# Patient Record
Sex: Female | Born: 1985 | Race: Black or African American | Hispanic: No | Marital: Single | Smoking: Current every day smoker
Health system: Southern US, Community
[De-identification: ages and names within clinical notes are randomized; demographics above are authoritative.]

---

## 2015-02-14 ENCOUNTER — Encounter (HOSPITAL_BASED_OUTPATIENT_CLINIC_OR_DEPARTMENT_OTHER): Payer: Self-pay | Admitting: Emergency Medicine

## 2015-02-14 ENCOUNTER — Emergency Department (HOSPITAL_BASED_OUTPATIENT_CLINIC_OR_DEPARTMENT_OTHER): Payer: Self-pay

## 2015-02-14 ENCOUNTER — Inpatient Hospital Stay (HOSPITAL_BASED_OUTPATIENT_CLINIC_OR_DEPARTMENT_OTHER)
Admission: EM | Admit: 2015-02-14 | Discharge: 2015-02-17 | DRG: 854 | Disposition: A | Discharge status: Home / Self Care | Payer: Self-pay | Attending: Internal Medicine | Admitting: Internal Medicine

## 2015-02-14 DIAGNOSIS — N1330 Unspecified hydronephrosis: Secondary | ICD-10-CM | POA: Diagnosis present

## 2015-02-14 DIAGNOSIS — A419 Sepsis, unspecified organism: Principal | ICD-10-CM | POA: Diagnosis present

## 2015-02-14 DIAGNOSIS — N12 Tubulo-interstitial nephritis, not specified as acute or chronic: Secondary | ICD-10-CM | POA: Diagnosis present

## 2015-02-14 DIAGNOSIS — F17200 Nicotine dependence, unspecified, uncomplicated: Secondary | ICD-10-CM | POA: Diagnosis present

## 2015-02-14 DIAGNOSIS — N200 Calculus of kidney: Secondary | ICD-10-CM

## 2015-02-14 DIAGNOSIS — Z8249 Family history of ischemic heart disease and other diseases of the circulatory system: Secondary | ICD-10-CM

## 2015-02-14 DIAGNOSIS — B9620 Unspecified Escherichia coli [E. coli] as the cause of diseases classified elsewhere: Secondary | ICD-10-CM | POA: Diagnosis present

## 2015-02-14 DIAGNOSIS — E876 Hypokalemia: Secondary | ICD-10-CM | POA: Diagnosis present

## 2015-02-14 DIAGNOSIS — N132 Hydronephrosis with renal and ureteral calculous obstruction: Secondary | ICD-10-CM | POA: Diagnosis present

## 2015-02-14 DIAGNOSIS — Z808 Family history of malignant neoplasm of other organs or systems: Secondary | ICD-10-CM

## 2015-02-14 DIAGNOSIS — R109 Unspecified abdominal pain: Secondary | ICD-10-CM | POA: Diagnosis present

## 2015-02-14 LAB — COMPREHENSIVE METABOLIC PANEL
ALBUMIN: 3.8 g/dL (ref 3.5–5.0)
ALT: 35 U/L (ref 14–54)
ANION GAP: 12 (ref 5–15)
AST: 31 U/L (ref 15–41)
Alkaline Phosphatase: 56 U/L (ref 38–126)
BUN: 7 mg/dL (ref 6–20)
CHLORIDE: 102 mmol/L (ref 101–111)
CO2: 21 mmol/L — AB (ref 22–32)
Calcium: 9.2 mg/dL (ref 8.9–10.3)
Creatinine, Ser: 0.84 mg/dL (ref 0.44–1.00)
GFR calc Af Amer: >60 mL/min (ref >60)
GFR calc non Af Amer: >60 mL/min (ref >60)
GLUCOSE: 159 mg/dL — AB (ref 65–99)
POTASSIUM: 3.1 mmol/L — AB (ref 3.5–5.1)
SODIUM: 135 mmol/L (ref 135–145)
Total Bilirubin: 1.3 mg/dL — ABNORMAL HIGH (ref 0.3–1.2)
Total Protein: 8.1 g/dL (ref 6.5–8.1)

## 2015-02-14 LAB — CBC WITH DIFFERENTIAL/PLATELET
Basophils Absolute: 0 10*3/uL (ref 0.0–0.1)
Basophils Relative: 0 %
Eosinophils Absolute: 0 10*3/uL (ref 0.0–0.7)
Eosinophils Relative: 0 %
HEMATOCRIT: 38.3 % (ref 36.0–46.0)
Hemoglobin: 13.5 g/dL (ref 12.0–15.0)
LYMPHS ABS: 1.1 10*3/uL (ref 0.7–4.0)
LYMPHS PCT: 9 %
MCH: 34.4 pg — ABNORMAL HIGH (ref 26.0–34.0)
MCHC: 35.2 g/dL (ref 30.0–36.0)
MCV: 97.5 fL (ref 78.0–100.0)
MONO ABS: 1.5 10*3/uL — AB (ref 0.1–1.0)
MONOS PCT: 11 %
NEUTROS ABS: 10.7 10*3/uL — AB (ref 1.7–7.7)
Neutrophils Relative %: 80 %
Platelets: 261 10*3/uL (ref 150–400)
RBC: 3.93 MIL/uL (ref 3.87–5.11)
RDW: 12.7 % (ref 11.5–15.5)
WBC: 13.3 10*3/uL — ABNORMAL HIGH (ref 4.0–10.5)

## 2015-02-14 LAB — URINALYSIS, ROUTINE W REFLEX MICROSCOPIC
Bilirubin Urine: NEGATIVE
GLUCOSE, UA: NEGATIVE mg/dL
Ketones, ur: >80 mg/dL — AB
Nitrite: POSITIVE — AB
PROTEIN: 30 mg/dL — AB
Specific Gravity, Urine: 1.015 (ref 1.005–1.030)
Urobilinogen, UA: 1 mg/dL (ref 0.0–1.0)
pH: 6 (ref 5.0–8.0)

## 2015-02-14 LAB — URINE MICROSCOPIC-ADD ON

## 2015-02-14 LAB — I-STAT CG4 LACTIC ACID, ED: LACTIC ACID, VENOUS: 1.76 mmol/L (ref 0.5–2.0)

## 2015-02-14 LAB — PREGNANCY, URINE: PREG TEST UR: NEGATIVE

## 2015-02-14 MED ORDER — CEFTRIAXONE SODIUM 2 G IJ SOLR
INTRAMUSCULAR | Status: AC
  Filled 2015-02-14: qty 2

## 2015-02-14 MED ORDER — SODIUM CHLORIDE 0.9 % IV SOLN
INTRAVENOUS | Status: AC
  Administered 2015-02-14: 150 mL/h via INTRAVENOUS
  Administered 2015-02-15: 06:00:00 via INTRAVENOUS

## 2015-02-14 MED ORDER — ONDANSETRON HCL 4 MG/2ML IJ SOLN
4.0000 mg | Freq: Four times a day (QID) | INTRAMUSCULAR | Status: DC | PRN
  Administered 2015-02-15: 4 mg via INTRAVENOUS

## 2015-02-14 MED ORDER — ACETAMINOPHEN 650 MG RE SUPP: 650.0000 mg | Freq: Four times a day (QID) | RECTAL | Status: DC | PRN

## 2015-02-14 MED ORDER — ONDANSETRON HCL 4 MG PO TABS: 4.0000 mg | ORAL_TABLET | Freq: Four times a day (QID) | ORAL | Status: DC | PRN

## 2015-02-14 MED ORDER — HYDROMORPHONE HCL 1 MG/ML IJ SOLN
0.5000 mg | INTRAMUSCULAR | Status: DC | PRN
  Administered 2015-02-14 – 2015-02-15 (×2): 0.5 mg via INTRAVENOUS
  Filled 2015-02-14 (×2): qty 1

## 2015-02-14 MED ORDER — HYDROCODONE-ACETAMINOPHEN 5-325 MG PO TABS
1.0000 | ORAL_TABLET | ORAL | Status: DC | PRN
  Administered 2015-02-15 – 2015-02-17 (×8): 2 via ORAL
  Filled 2015-02-14 (×9): qty 2

## 2015-02-14 MED ORDER — SODIUM CHLORIDE 0.9 % IV BOLUS (SEPSIS)
1000.0000 mL | INTRAVENOUS | Status: AC
  Administered 2015-02-14 (×2): 1000 mL via INTRAVENOUS

## 2015-02-14 MED ORDER — POTASSIUM CHLORIDE 10 MEQ/100ML IV SOLN
10.0000 meq | INTRAVENOUS | Status: AC
  Administered 2015-02-14 – 2015-02-15 (×4): 10 meq via INTRAVENOUS
  Filled 2015-02-14 (×4): qty 100

## 2015-02-14 MED ORDER — ACETAMINOPHEN 325 MG PO TABS: 650.0000 mg | ORAL_TABLET | Freq: Four times a day (QID) | ORAL | Status: DC | PRN

## 2015-02-14 MED ORDER — ACETAMINOPHEN 325 MG PO TABS
650.0000 mg | ORAL_TABLET | Freq: Once | ORAL | Status: AC
  Administered 2015-02-14: 650 mg via ORAL
  Filled 2015-02-14: qty 2

## 2015-02-14 MED ORDER — DEXTROSE 5 % IV SOLN
2.0000 g | Freq: Once | INTRAVENOUS | Status: AC
  Administered 2015-02-14: 2 g via INTRAVENOUS

## 2015-02-14 MED ORDER — DEXTROSE 5 % IV SOLN
1.0000 g | INTRAVENOUS | Status: DC
  Administered 2015-02-15 – 2015-02-16 (×2): 1 g via INTRAVENOUS
  Filled 2015-02-14 (×2): qty 10

## 2015-02-14 MED ORDER — SODIUM CHLORIDE 0.9 % IJ SOLN
3.0000 mL | Freq: Two times a day (BID) | INTRAMUSCULAR | Status: DC
  Administered 2015-02-14 – 2015-02-17 (×3): 3 mL via INTRAVENOUS

## 2015-02-14 MED ORDER — SODIUM CHLORIDE 0.9 % IV BOLUS (SEPSIS)
1000.0000 mL | Freq: Once | INTRAVENOUS | Status: AC
  Administered 2015-02-14: 1000 mL via INTRAVENOUS

## 2015-02-14 MED ORDER — SODIUM CHLORIDE 0.9 % IV BOLUS (SEPSIS): 500.0000 mL | INTRAVENOUS | Status: AC

## 2015-02-14 NOTE — ED Provider Notes (Signed)
CSN: [id number]     Arrival date & time 02/14/15  1725 History   First MD Initiated Contact with Patient 02/14/15 1747     Chief Complaint  Patient presents with  . Flank Pain     (Consider location/radiation/quality/duration/timing/severity/associated sxs/prior Treatment) HPI Comments: Patient presents with right flank pain. She states over last 3 days she's had some urinary urgency and frequency. She started having some pain in her right lower back which is worsened and radiated to her right side. She denies any hematuria. She does have a history of UTIs in the past but no history of kidney stones. Today she started feeling worse. She's been febrile. She has myalgias. She's had objective fevers and chills since yesterday. She denies any nausea or vomiting. There is no diarrhea. No cough or chest congestion. No wounds or rashes. She's taken Tylenol with some relief in symptoms.  Patient is a 29 y.o. female presenting with flank pain.  Flank Pain Associated symptoms include abdominal pain. Pertinent negatives include no chest pain, no headaches and no shortness of breath.    History reviewed. No pertinent past medical history. History reviewed. No pertinent past surgical history. No family history on file. Social History  Substance Use Topics  . Smoking status: Current Every Day Smoker -- 0.50 packs/day  . Smokeless tobacco: None  . Alcohol Use: Yes     Comment: rarely   OB History    No data available     Review of Systems  Constitutional: Positive for fever, chills, appetite change and fatigue. Negative for diaphoresis.  HENT: Negative for congestion, rhinorrhea and sneezing.   Eyes: Negative.   Respiratory: Negative for cough, chest tightness and shortness of breath.   Cardiovascular: Negative for chest pain and leg swelling.  Gastrointestinal: Positive for abdominal pain. Negative for nausea, vomiting, diarrhea and blood in stool.  Genitourinary: Positive for flank pain.  Negative for frequency, hematuria and difficulty urinating.  Musculoskeletal: Negative for back pain and arthralgias.  Skin: Negative for rash.  Neurological: Negative for dizziness, speech difficulty, weakness, numbness and headaches.      Allergies  Review of patient's allergies indicates no known allergies.  Home Medications   Prior to Admission medications   Not on File   BP 137/72 mmHg  Pulse 87  Temp(Src) 101.2 F (38.4 C) (Oral)  Resp 16  Ht _0  (1.702 m)  Wt 170 lb (77.111 kg)  BMI 26.62 kg/m2  SpO2 100%  LMP 12/17/2014 (Exact Date) Physical Exam  Constitutional: She is oriented to person, place, and time. She appears well-developed and well-nourished.  HENT:  Head: Normocephalic and atraumatic.  Eyes: Pupils are equal, round, and reactive to light.  Neck: Normal range of motion. Neck supple.  Cardiovascular: Regular rhythm and normal heart sounds.  Tachycardia present.   Pulmonary/Chest: Effort normal and breath sounds normal. Tachypnea noted. No respiratory distress. She has no wheezes. She has no rales. She exhibits no tenderness.  Abdominal: Soft. Bowel sounds are normal. There is tenderness (moderate tenderness to right flank and right midabdomen. She has positive right CVA tenderness). There is no rebound and no guarding.  Musculoskeletal: Normal range of motion. She exhibits no edema.  Lymphadenopathy:    She has no cervical adenopathy.  Neurological: She is alert and oriented to person, place, and time.  Skin: Skin is warm and dry. No rash noted.  Psychiatric: She has a normal mood and affect.    ED Course  Procedures (including critical care time) Labs  Review Labs Reviewed  URINALYSIS, ROUTINE W REFLEX MICROSCOPIC (NOT AT [facility]) - Abnormal; Notable for the following:    Color, Urine AMBER (*)    APPearance CLOUDY (*)    Hgb urine dipstick SMALL (*)    Ketones, ur >80 (*)    Protein, ur 30 (*)    Nitrite POSITIVE (*)    Leukocytes, UA MODERATE  (*)    All other components within normal limits  COMPREHENSIVE METABOLIC PANEL - Abnormal; Notable for the following:    Potassium 3.1 (*)    CO2 21 (*)    Glucose, Bld 159 (*)    Total Bilirubin 1.3 (*)    All other components within normal limits  CBC WITH DIFFERENTIAL/PLATELET - Abnormal; Notable for the following:    WBC 13.3 (*)    MCH 34.4 (*)    Neutro Abs 10.7 (*)    Monocytes Absolute 1.5 (*)    All other components within normal limits  URINE MICROSCOPIC-ADD ON - Abnormal; Notable for the following:    Squamous Epithelial / LPF FEW (*)    Bacteria, UA MANY (*)    All other components within normal limits  CULTURE, BLOOD (ROUTINE X 2)  CULTURE, BLOOD (ROUTINE X 2)  URINE CULTURE  PREGNANCY, URINE  I-STAT CG4 LACTIC ACID, ED    Imaging Review Dg Chest 2 View  02/14/2015   CLINICAL DATA:  Right-sided chest pain for 3 days.  EXAM: CHEST  2 VIEW  COMPARISON:  None.  FINDINGS: Bilateral mild interstitial thickening which may reflect an infectious or inflammatory etiology. There is no focal parenchymal opacity. There is no pleural effusion or pneumothorax. The heart and mediastinal contours are unremarkable.  The osseous structures are unremarkable.  IMPRESSION: Bilateral mild interstitial thickening which may reflect an infectious or inflammatory etiology.   Electronically Signed   By: Kathreen Devoid   On: 02/14/2015 18:50   Ct Renal Stone Study  02/14/2015   CLINICAL DATA:  Right flank pain and right lower quadrant pain for 2 days.  EXAM: CT ABDOMEN AND PELVIS WITHOUT CONTRAST  TECHNIQUE: Multidetector CT imaging of the abdomen and pelvis was performed following the standard protocol without IV contrast.  COMPARISON:  None.  FINDINGS: The liver, spleen, pancreas, gallbladder, adrenal glands and left kidney are normal. The aorta is normal. There is no abdominal lymphadenopathy. There is no small bowel obstruction or diverticulitis. The appendix is normal.  There is right perinephric  stranding. There is no nephrolithiasis bilaterally. There is mild right hydronephrosis with probable 1 mm stone in the proximal right ureter on image for 43.  The bladder is normal. The uterus is normal. There is a 4 x 5 cm cyst in the right ovary likely follicular cysts. Pelvic phleboliths are identified. The lung bases are clear. No acute abnormalities identified in the visualized bones.  IMPRESSION: Stranding and inflammation surrounding the right kidney. There is mild right hydronephrosis probably due to a stones 1 mm in the proximal right ureter.  The appendix is normal.   Electronically Signed   By: Abelardo Diesel M.D.   On: 02/14/2015 18:54   I have personally reviewed and evaluated these images and lab results as part of my medical decision-making.   EKG Interpretation   Date/Time:  Friday February 14 2015 18:08:04 EDT Ventricular Rate:  82 PR Interval:  144 QRS Duration: 88 QT Interval:  352 QTC Calculation: 411 R Axis:   12 Text Interpretation:  Normal sinus rhythm Cannot rule out  Anterior infarct  , age undetermined Abnormal ECG No old tracing to compare Confirmed by  [place]  MD, [place] [phone]) on 02/14/2015 6:49:07 PM      MDM   Final diagnoses:  Pyelonephritis    Patient presents with right flank pain and associated urinary symptoms. She met criteria for sepsis on arrival and sepsis orders were initiated. She was covered with Rocephin for likely pyelonephritis. She did have some interstitial changes on chest x-ray but she doesn't have any symptoms that would be more consistent with pneumonia. I did obtain a CT scan which showed a possible proximal 1 mm ureteral stone. I consulted with Dr.) with urology who recommends the patient be admitted to the hospital service and he will consult on the patient. I spoke with Dr. Dia Crawford who is accepted patient for admission to [facility]. Per Dr. Ralene Muskrat request, patient is to be nothing by mouth until urology consult on the  patient.  CRITICAL CARE Performed by: BELFI, MELANIE Total critical care time: 45 Critical care time was exclusive of separately billable procedures and treating other patients. Critical care was necessary to treat or prevent imminent or life-threatening deterioration. Critical care was time spent personally by me on the following activities: development of treatment plan with patient and/or surrogate as well as nursing, discussions with consultants, evaluation of patient's response to treatment, examination of patient, obtaining history from patient or surrogate, ordering and performing treatments and interventions, ordering and review of laboratory studies, ordering and review of radiographic studies, pulse oximetry and re-evaluation of patient's condition.     Malvin Johns, MD 02/14/15 2006

## 2015-02-14 NOTE — H&P (Signed)
PCP: none recently moved   Referring provider Transferred from [facility]   Chief Complaint:  Right flank pain  HPI: Tricia Kelly is a 29 y.o. female   has no past medical history on file.   Presented with  3 day hx of back pain now moving to the flank. She have had a fever for the past 2-3 days she though it was a virus denies any pain with urination. She have had some headaches and feeling weak. Denies any nausea vomiting or diarrhea.   Hospitalist was called for admission for Pyelonephritis and hydronephrosis  Review of Systems:    Pertinent positives include: Fevers, chills, back pain  Constitutional:  No weight loss, night sweats, fatigue, weight loss  HEENT:  No headaches, Difficulty swallowing,Tooth/dental problems,Sore throat,  No sneezing, itching, ear ache, nasal congestion, post nasal drip,  Cardio-vascular:  No chest pain, Orthopnea, PND, anasarca, dizziness, palpitations.no Bilateral lower extremity swelling  GI:  No heartburn, indigestion, abdominal pain, nausea, vomiting, diarrhea, change in bowel habits, loss of appetite, melena, blood in stool, hematemesis Resp:  no shortness of breath at rest. No dyspnea on exertion, No excess mucus, no productive cough, No non-productive cough, No coughing up of blood.No change in color of mucus.No wheezing. Skin:  no rash or lesions. No jaundice GU:  no dysuria, change in color of urine, no urgency or frequency. No straining to urinate.  No flank pain.  Musculoskeletal:  No joint pain or no joint swelling. No decreased range of motion. No back pain.  Psych:  No change in mood or affect. No depression or anxiety. No memory loss.  Neuro: no localizing neurological complaints, no tingling, no weakness, no double vision, no gait abnormality, no slurred speech, no confusion  Otherwise ROS are negative except for above, 10 systems were reviewed  Past Medical History: History reviewed. No pertinent past medical  history. History reviewed. No pertinent past surgical history.   Medications: Prior to Admission medications   Medication Sig Start Date End Date Taking? Authorizing Provider  acetaminophen (TYLENOL) 500 MG tablet Take 1,000 mg by mouth every 6 (six) hours as needed for moderate pain.   Yes Historical Provider, MD    Allergies:  No Known Allergies  Social History:  Ambulatory  Independently  Lives at home alone,       reports that she has been smoking.  She does not have any smokeless tobacco history on file. She reports that she drinks alcohol.    Family History: family history includes Hypertension in her mother and other; Thyroid cancer in her mother.    Physical Exam: Patient Vitals for the past 24 hrs:  BP Temp Temp src Pulse Resp SpO2 Height Weight  02/14/15 2151 123/77 mmHg 98.2 F (36.8 C) Oral 76 18 100 % - -  02/14/15 2030 130/82 mmHg - - 83 22 100 % - -  02/14/15 2015 129/70 mmHg - - 78 16 100 % - -  02/14/15 2008 - 98.9 F (37.2 C) - - - - - -  02/14/15 2000 134/77 mmHg - - 85 17 100 % - -  02/14/15 1945 144/80 mmHg - - 84 22 100 % - -  02/14/15 1930 137/72 mmHg - - 87 16 100 % - -  02/14/15 1915 135/71 mmHg - - 90 25 100 % - -  02/14/15 1845 132/63 mmHg - - 94 17 100 % - -  02/14/15 1830 149/85 mmHg - - 92 17 100 % - -  02/14/15  1801 138/81 mmHg - - 91 (!) 27 100 % - -  02/14/15 1759 - - - 91 16 100 % - -  02/14/15 1746 - - - 105 (!) 30 100 % - -  02/14/15 1730 141/88 mmHg 101.2 F (38.4 C) Oral (!) 130 (!) 44 100 %  (1.702 m) 77.111 kg (170 lb)    1. General:  in No Acute distress 2. Psychological: Alert and   Oriented 3. Head/ENT:    Dry Mucous Membranes                          Head Non traumatic, neck supple                          Normal   Dentition 4. SKIN:  decreased Skin turgor,  Skin clean Dry and intact no rash 5. Heart: Regular rate and rhythm no Murmur, Rub or gallop 6. Lungs: Clear to auscultation bilaterally, no wheezes or  crackles   7. Abdomen: Soft, right lower tenderness, Non distended 8. Lower extremities: no clubbing, cyanosis, or edema 9. Neurologically Grossly intact, moving all 4 extremities equally 10. MSK: Normal range of motion  body mass index is 26.62 kg/(m^2).   Labs on Admission:   Results for orders placed or performed during the hospital encounter of 02/14/15 (from the past 24 hour(s))  Urinalysis, Routine w reflex microscopic (not at [facility])     Status: Abnormal   Collection Time: 02/14/15  5:35 PM  Result Value Ref Range   Color, Urine AMBER (A) YELLOW   APPearance CLOUDY (A) CLEAR   Specific Gravity, Urine 1.015 1.005 - 1.030   pH 6.0 5.0 - 8.0   Glucose, UA NEGATIVE NEGATIVE mg/dL   Hgb urine dipstick SMALL (A) NEGATIVE   Bilirubin Urine NEGATIVE NEGATIVE   Ketones, ur >80 (A) NEGATIVE mg/dL   Protein, ur 30 (A) NEGATIVE mg/dL   Urobilinogen, UA 1.0 0.0 - 1.0 mg/dL   Nitrite POSITIVE (A) NEGATIVE   Leukocytes, UA MODERATE (A) NEGATIVE  Pregnancy, urine     Status: None   Collection Time: 02/14/15  5:35 PM  Result Value Ref Range   Preg Test, Ur NEGATIVE NEGATIVE  Urine microscopic-add on     Status: Abnormal   Collection Time: 02/14/15  5:35 PM  Result Value Ref Range   Squamous Epithelial / LPF FEW (A) RARE   WBC, UA 21-50 <3 WBC/hpf   RBC / HPF 3-6 <3 RBC/hpf   Bacteria, UA MANY (A) RARE  Comprehensive metabolic panel     Status: Abnormal   Collection Time: 02/14/15  5:55 PM  Result Value Ref Range   Sodium 135 135 - 145 mmol/L   Potassium 3.1 (L) 3.5 - 5.1 mmol/L   Chloride 102 101 - 111 mmol/L   CO2 21 (L) 22 - 32 mmol/L   Glucose, Bld 159 (H) 65 - 99 mg/dL   BUN 7 6 - 20 mg/dL   Creatinine, Ser 2.44 0.44 - 1.00 mg/dL   Calcium 9.2 8.9 - 01.0 mg/dL   Total Protein 8.1 6.5 - 8.1 g/dL   Albumin 3.8 3.5 - 5.0 g/dL   AST 31 15 - 41 U/L   ALT 35 14 - 54 U/L   Alkaline Phosphatase 56 38 - 126 U/L   Total Bilirubin 1.3 (H) 0.3 - 1.2 mg/dL   GFR calc non Af Amer >60  >60 mL/min  GFR calc Af Amer >60 >60 mL/min   Anion gap 12 5 - 15  CBC WITH DIFFERENTIAL     Status: Abnormal   Collection Time: 02/14/15  5:55 PM  Result Value Ref Range   WBC 13.3 (H) 4.0 - 10.5 K/uL   RBC 3.93 3.87 - 5.11 MIL/uL   Hemoglobin 13.5 12.0 - 15.0 g/dL   HCT 81.1 91.4 - 78.2 %   MCV 97.5 78.0 - 100.0 fL   MCH 34.4 (H) 26.0 - 34.0 pg   MCHC 35.2 30.0 - 36.0 g/dL   RDW 95.6 21.3 - 08.6 %   Platelets 261 150 - 400 K/uL   Neutrophils Relative % 80 %   Neutro Abs 10.7 (H) 1.7 - 7.7 K/uL   Lymphocytes Relative 9 %   Lymphs Abs 1.1 0.7 - 4.0 K/uL   Monocytes Relative 11 %   Monocytes Absolute 1.5 (H) 0.1 - 1.0 K/uL   Eosinophils Relative 0 %   Eosinophils Absolute 0.0 0.0 - 0.7 K/uL   Basophils Relative 0 %   Basophils Absolute 0.0 0.0 - 0.1 K/uL  Blood Culture (routine x 2)     Status: None (Preliminary result)   Collection Time: 02/14/15  5:55 PM  Result Value Ref Range   Specimen Description      BLOOD LEFT HAND Performed at [facility]    Special Requests BOTTLES DRAWN AEROBIC AND ANAEROBIC 5 CC EACH    Culture PENDING    Report Status PENDING   Blood Culture (routine x 2)     Status: None (Preliminary result)   Collection Time: 02/14/15  5:56 PM  Result Value Ref Range   Specimen Description      BLOOD RIGHT ANTECUBITAL Performed at [facility]    Special Requests BOTTLES DRAWN AEROBIC AND ANAEROBIC 10 CC    Culture PENDING    Report Status PENDING   I-Stat CG4 Lactic Acid, ED  (not at  [facility])     Status: None   Collection Time: 02/14/15  6:08 PM  Result Value Ref Range   Lactic Acid, Venous 1.76 0.5 - 2.0 mmol/L    UA  Evidence of UTI  No results found for: HGBA1C  Estimated Creatinine Clearance: 105.8 mL/min (by C-G formula based on Cr of 0.84).  BNP (last 3 results) No results for input(s): PROBNP in the last 8760 hours.  Other results:  I have pearsonaly reviewed this: ECG REPORT  Rate: 82  Rhythm: NSR ST&T Change: no  ischemic changes QTC 411   Filed Weights   02/14/15 1730  Weight: 77.111 kg (170 lb)     Cultures:    Component Value Date/Time   SDES  02/14/2015 1756    BLOOD RIGHT ANTECUBITAL Performed at [facility]    SPECREQUEST BOTTLES DRAWN AEROBIC AND ANAEROBIC 10 CC 02/14/2015 1756   CULT PENDING 02/14/2015 1756   REPTSTATUS PENDING 02/14/2015 1756     Radiological Exams on Admission: Dg Chest 2 View  02/14/2015   CLINICAL DATA:  Right-sided chest pain for 3 days.  EXAM: CHEST  2 VIEW  COMPARISON:  None.  FINDINGS: Bilateral mild interstitial thickening which may reflect an infectious or inflammatory etiology. There is no focal parenchymal opacity. There is no pleural effusion or pneumothorax. The heart and mediastinal contours are unremarkable.  The osseous structures are unremarkable.  IMPRESSION: Bilateral mild interstitial thickening which may reflect an infectious or inflammatory etiology.   Electronically Signed   By: Alan Ripper  Patel   On: 02/14/2015 18:50   Ct Renal Stone Study  02/14/2015   CLINICAL DATA:  Right flank pain and right lower quadrant pain for 2 days.  EXAM: CT ABDOMEN AND PELVIS WITHOUT CONTRAST  TECHNIQUE: Multidetector CT imaging of the abdomen and pelvis was performed following the standard protocol without IV contrast.  COMPARISON:  None.  FINDINGS: The liver, spleen, pancreas, gallbladder, adrenal glands and left kidney are normal. The aorta is normal. There is no abdominal lymphadenopathy. There is no small bowel obstruction or diverticulitis. The appendix is normal.  There is right perinephric stranding. There is no nephrolithiasis bilaterally. There is mild right hydronephrosis with probable 1 mm stone in the proximal right ureter on image for 43.  The bladder is normal. The uterus is normal. There is a 4 x 5 cm cyst in the right ovary likely follicular cysts. Pelvic phleboliths are identified. The lung bases are clear. No acute abnormalities identified in the  visualized bones.  IMPRESSION: Stranding and inflammation surrounding the right kidney. There is mild right hydronephrosis probably due to a stones 1 mm in the proximal right ureter.  The appendix is normal.   Electronically Signed   By: Sherian Rein M.D.   On: 02/14/2015 18:54    Chart has been reviewed  Family not at  Bedside    Assessment/Plan  29 yo F with no prior medical HX here with back pain and fever was found to have Right 1mm nephrolithiasis with mild hydronephrosis and evidence of pyelonephritis resulting in sepsis  Present on Admission:  . Pyelonephritis - treat with rocephin, await result of urine culture . Hydronephrosis - urology aware will follow Cr . Hypokalemia - will replace and recheck, check Mg level Sepsis - admit for aggressive IVF resuscitation, IV antibiotics, obtain blood and urine culture, follow up lactic acid levels   Prophylaxis: SCD    CODE STATUS:  FULL CODE  as per patient    Disposition:  To home once workup is complete and patient is stable  Other plan as per orders.  I have spent a total of on this admission extra time spent discussing case with Dr. Veronia Beets 02/14/2015, 10:43 PM  [facility]  Pager [phone]   after 2 AM please page floor coverage PA If 7AM-7PM, please contact the day team taking care of the patient  [email or web address]  Password [email or web address]

## 2015-02-14 NOTE — Consult Note (Signed)
Subjective: I was asked to see Tricia Kelly in consultation by Dr. Joseph Art for a right proximal stone with mild hydro and pyelonephritis.   She had the onset 3 days ago of right upper back and then moved down to her side.  The pain was severe but she had no nausea.  She had no dysuria or hematuria.  She began to have fever over the last 2 days but she thought it was a virus.  She went to [facility] and a CT showed a possible 1mm right proximal stone with mild obstruction.  She has no prior history of stones, but she has had a prior kidney infection. Her UA is nit+ with pyuria and her WBC is 13.3.  She has a glucose of 159 but normal renal function and lactate levels.  ROS:  Review of Systems  Constitutional: Positive for fever, chills and malaise/fatigue.  Respiratory: Negative for cough and shortness of breath.   Cardiovascular: Negative for chest pain, palpitations and leg swelling.  Gastrointestinal: Positive for abdominal pain. Negative for nausea, vomiting, diarrhea and constipation.  Genitourinary: Positive for urgency, frequency (q25min) and flank pain. Negative for dysuria.       She has a 2-3 day history of UUI.  Musculoskeletal: Positive for myalgias.  All other systems reviewed and are negative.   No Known Allergies  History reviewed. No pertinent past medical history.  History reviewed. No pertinent past surgical history.  Social History   Social History  . Marital Status: Single    Spouse Name: N/A  . Number of Children: N/A  . Years of Education: N/A   Occupational History  . Not on file.   Social History Main Topics  . Smoking status: Current Every Day Smoker -- 0.50 packs/day  . Smokeless tobacco: Not on file  . Alcohol Use: Yes     Comment: rarely  . Drug Use: Not on file  . Sexual Activity: Yes    Birth Control/ Protection: None   Other Topics Concern  . Not on file   Social History Narrative  . No narrative on file    No family history on  file.  Anti-infectives: Anti-infectives    Start     Dose/Rate Route Frequency Ordered Stop   02/14/15 1804  cefTRIAXone (ROCEPHIN) 2 G injection    Comments:  [name]   : cabinet override      02/14/15 1804 02/14/15 1807   02/14/15 1800  cefTRIAXone (ROCEPHIN) 2 g in dextrose 5 % 50 mL IVPB     2 g 100 mL/hr over 30 Minutes Intravenous  Once 02/14/15 1757 02/14/15 1855      No current facility-administered medications for this encounter.     Objective: Vital signs in last 24 hours: Temp:  [98.2 F (36.8 C)-101.2 F (38.4 C)] 98.2 F (36.8 C) (09/30 2151) Pulse Rate:  [76-130] 76 (09/30 2151) Resp:  [16-44] 18 (09/30 2151) BP: (123-149)/(63-88) 123/77 mmHg (09/30 2151) SpO2:  [100 %] 100 % (09/30 2151) Weight:  [77.111 kg (170 lb)] 77.111 kg (170 lb) (09/30 1730)  Intake/Output from previous day:   Intake/Output this shift:     Physical Exam  Constitutional: She is oriented to person, place, and time and well-developed, well-nourished, and in no distress. No distress.  HENT:  Head: Normocephalic and atraumatic.  Neck: Normal range of motion. Neck supple. No thyromegaly present.  Cardiovascular: Normal rate, regular rhythm and normal heart sounds.   Pulmonary/Chest: Effort normal and breath sounds  normal. No respiratory distress.  Abdominal: Soft. There is tenderness (RUQ and flank ). There is guarding.  Musculoskeletal: Normal range of motion. She exhibits no edema or tenderness.  Lymphadenopathy:    She has no cervical adenopathy.  Neurological: She is alert and oriented to person, place, and time.  Skin: Skin is warm and dry.  Psychiatric: Mood and affect normal.  Vitals reviewed.   Lab Results:   Recent Labs  02/14/15 1755  WBC 13.3*  HGB 13.5  HCT 38.3  PLT 261   BMET  Recent Labs  02/14/15 1755  NA 135  K 3.1*  CL 102  CO2 21*  GLUCOSE 159*  BUN 7  CREATININE 0.84  CALCIUM 9.2   PT/INR No results for input(s): LABPROT, INR in the  last 72 hours. ABG No results for input(s): PHART, HCO3 in the last 72 hours.  Invalid input(s): PCO2, PO2  Studies/Results: Dg Chest 2 View  02/14/2015   CLINICAL DATA:  Right-sided chest pain for 3 days.  EXAM: CHEST  2 VIEW  COMPARISON:  None.  FINDINGS: Bilateral mild interstitial thickening which may reflect an infectious or inflammatory etiology. There is no focal parenchymal opacity. There is no pleural effusion or pneumothorax. The heart and mediastinal contours are unremarkable.  The osseous structures are unremarkable.  IMPRESSION: Bilateral mild interstitial thickening which may reflect an infectious or inflammatory etiology.   Electronically Signed   By: Elige Ko   On: 02/14/2015 18:50   Ct Renal Stone Study  02/14/2015   CLINICAL DATA:  Right flank pain and right lower quadrant pain for 2 days.  EXAM: CT ABDOMEN AND PELVIS WITHOUT CONTRAST  TECHNIQUE: Multidetector CT imaging of the abdomen and pelvis was performed following the standard protocol without IV contrast.  COMPARISON:  None.  FINDINGS: The liver, spleen, pancreas, gallbladder, adrenal glands and left kidney are normal. The aorta is normal. There is no abdominal lymphadenopathy. There is no small bowel obstruction or diverticulitis. The appendix is normal.  There is right perinephric stranding. There is no nephrolithiasis bilaterally. There is mild right hydronephrosis with probable 1 mm stone in the proximal right ureter on image for 43.  The bladder is normal. The uterus is normal. There is a 4 x 5 cm cyst in the right ovary likely follicular cysts. Pelvic phleboliths are identified. The lung bases are clear. No acute abnormalities identified in the visualized bones.  IMPRESSION: Stranding and inflammation surrounding the right kidney. There is mild right hydronephrosis probably due to a stones 1 mm in the proximal right ureter.  The appendix is normal.   Electronically Signed   By: Sherian Rein M.D.   On: 02/14/2015 18:54    I discussed the case with the ER doc.  I have reviewed her CT films and report.  I have reviewed her labs and UA.   Assessment: She has apparent right pyelonephritis with a possible 1mm proximal stone with mild dilation.   She is now afebrile and no longer tachycardic.   I discussed the need for a stent to relieve the obstruction and drain the infection.  She is very reluctant to proceed with that at this time and wants to wait until in the morning.    I discussed the risks of waiting and also reviewed the risks of the procedure including bleeding, infection, ureteral injury, need for secondary procedures, thrombotic events and anesthetic complications.   I have tried to encourage her to have it done tonight but she wants  to wait until her grandmother is here so I will keep her NPO and get her on for first thing in the morning, but if her condition declines we will have to press on.     CC: Dr. Carolyne Littles.     WRENN,JOHN J 02/14/2015 [phone]

## 2015-02-14 NOTE — ED Notes (Signed)
Right flank pain x3 days.  Radiating around toward RLQ x2 days. Urinary frequency.

## 2015-02-15 ENCOUNTER — Inpatient Hospital Stay (HOSPITAL_COMMUNITY): Payer: Self-pay | Admitting: Anesthesiology

## 2015-02-15 ENCOUNTER — Encounter (HOSPITAL_COMMUNITY)
Admission: EM | Disposition: A | Discharge status: Home / Self Care | Payer: Self-pay | Source: Home / Self Care | Attending: Internal Medicine

## 2015-02-15 ENCOUNTER — Encounter (HOSPITAL_COMMUNITY): Payer: Self-pay | Admitting: Anesthesiology

## 2015-02-15 DIAGNOSIS — N132 Hydronephrosis with renal and ureteral calculous obstruction: Secondary | ICD-10-CM

## 2015-02-15 DIAGNOSIS — A419 Sepsis, unspecified organism: Principal | ICD-10-CM

## 2015-02-15 DIAGNOSIS — N12 Tubulo-interstitial nephritis, not specified as acute or chronic: Secondary | ICD-10-CM

## 2015-02-15 HISTORY — PX: CYSTOSCOPY W/ URETERAL STENT PLACEMENT: SHX1429

## 2015-02-15 LAB — COMPREHENSIVE METABOLIC PANEL
ALT: 27 U/L (ref 14–54)
ANION GAP: 8 (ref 5–15)
AST: 18 U/L (ref 15–41)
Albumin: 3.2 g/dL — ABNORMAL LOW (ref 3.5–5.0)
Alkaline Phosphatase: 45 U/L (ref 38–126)
BUN: <5 mg/dL — ABNORMAL LOW (ref 6–20)
CALCIUM: 8.5 mg/dL — AB (ref 8.9–10.3)
CHLORIDE: 110 mmol/L (ref 101–111)
CO2: 22 mmol/L (ref 22–32)
Creatinine, Ser: 0.69 mg/dL (ref 0.44–1.00)
GFR calc non Af Amer: >60 mL/min (ref >60)
Glucose, Bld: 98 mg/dL (ref 65–99)
POTASSIUM: 3.7 mmol/L (ref 3.5–5.1)
SODIUM: 140 mmol/L (ref 135–145)
Total Bilirubin: 1.4 mg/dL — ABNORMAL HIGH (ref 0.3–1.2)
Total Protein: 6.7 g/dL (ref 6.5–8.1)

## 2015-02-15 LAB — CBC
HEMATOCRIT: 33.5 % — AB (ref 36.0–46.0)
HEMOGLOBIN: 11.6 g/dL — AB (ref 12.0–15.0)
MCH: 34.3 pg — AB (ref 26.0–34.0)
MCHC: 34.6 g/dL (ref 30.0–36.0)
MCV: 99.1 fL (ref 78.0–100.0)
Platelets: 222 10*3/uL (ref 150–400)
RBC: 3.38 MIL/uL — AB (ref 3.87–5.11)
RDW: 13.5 % (ref 11.5–15.5)
WBC: 11.3 10*3/uL — ABNORMAL HIGH (ref 4.0–10.5)

## 2015-02-15 LAB — MAGNESIUM: MAGNESIUM: 1.9 mg/dL (ref 1.7–2.4)

## 2015-02-15 LAB — LACTIC ACID, PLASMA: LACTIC ACID, VENOUS: 0.5 mmol/L (ref 0.5–2.0)

## 2015-02-15 LAB — SURGICAL PCR SCREEN
MRSA, PCR: NEGATIVE
STAPHYLOCOCCUS AUREUS: NEGATIVE

## 2015-02-15 LAB — PHOSPHORUS: PHOSPHORUS: 2.3 mg/dL — AB (ref 2.5–4.6)

## 2015-02-15 SURGERY — CYSTOSCOPY, WITH RETROGRADE PYELOGRAM AND URETERAL STENT INSERTION
Anesthesia: General | Site: Ureter | Laterality: Right

## 2015-02-15 MED ORDER — PROPOFOL 10 MG/ML IV BOLUS
INTRAVENOUS | Status: AC
  Filled 2015-02-15: qty 20

## 2015-02-15 MED ORDER — FENTANYL CITRATE (PF) 100 MCG/2ML IJ SOLN: 25.0000 ug | INTRAMUSCULAR | Status: DC | PRN

## 2015-02-15 MED ORDER — IOHEXOL 300 MG/ML  SOLN
INTRAMUSCULAR | Status: DC | PRN
  Administered 2015-02-15: 5 mL

## 2015-02-15 MED ORDER — SODIUM CHLORIDE 0.9 % IR SOLN
Status: DC | PRN
  Administered 2015-02-15: 3000 mL

## 2015-02-15 MED ORDER — SODIUM CHLORIDE 0.9 % IV SOLN
INTRAVENOUS | Status: DC | PRN
  Administered 2015-02-15: 10:00:00 via INTRAVENOUS

## 2015-02-15 MED ORDER — LACTATED RINGERS IV SOLN: INTRAVENOUS | Status: DC

## 2015-02-15 MED ORDER — MIDAZOLAM HCL 2 MG/2ML IJ SOLN
INTRAMUSCULAR | Status: AC
  Filled 2015-02-15: qty 4

## 2015-02-15 MED ORDER — FENTANYL CITRATE (PF) 100 MCG/2ML IJ SOLN
INTRAMUSCULAR | Status: AC
  Filled 2015-02-15: qty 4

## 2015-02-15 MED ORDER — MIDAZOLAM HCL 5 MG/5ML IJ SOLN
INTRAMUSCULAR | Status: DC | PRN
  Administered 2015-02-15: 2 mg via INTRAVENOUS

## 2015-02-15 MED ORDER — ONDANSETRON HCL 4 MG/2ML IJ SOLN
INTRAMUSCULAR | Status: AC
  Filled 2015-02-15: qty 2

## 2015-02-15 MED ORDER — LIDOCAINE HCL (CARDIAC) 20 MG/ML IV SOLN
INTRAVENOUS | Status: AC
  Filled 2015-02-15: qty 5

## 2015-02-15 MED ORDER — FENTANYL CITRATE (PF) 100 MCG/2ML IJ SOLN
INTRAMUSCULAR | Status: DC | PRN
  Administered 2015-02-15 (×4): 25 ug via INTRAVENOUS

## 2015-02-15 MED ORDER — PROPOFOL 10 MG/ML IV BOLUS
INTRAVENOUS | Status: DC | PRN
  Administered 2015-02-15: 150 mg via INTRAVENOUS

## 2015-02-15 MED ORDER — SODIUM CHLORIDE 0.9 % IV SOLN
INTRAVENOUS | Status: DC
  Administered 2015-02-16: 1000 mL via INTRAVENOUS
  Administered 2015-02-17: 07:00:00 via INTRAVENOUS

## 2015-02-15 SURGICAL SUPPLY — 11 items
BAG URO CATCHER STRL LF (DRAPE) ×3 IMPLANT
CATH URET 5FR 28IN OPEN ENDED (CATHETERS) ×3 IMPLANT
CLOTH BEACON ORANGE TIMEOUT ST (SAFETY) ×3 IMPLANT
GLOVE SURG SS PI 8.0 STRL IVOR (GLOVE) ×3 IMPLANT
GOWN STRL REUS W/TWL XL LVL3 (GOWN DISPOSABLE) ×3 IMPLANT
GUIDEWIRE STR DUAL SENSOR (WIRE) IMPLANT
MANIFOLD NEPTUNE II (INSTRUMENTS) ×3 IMPLANT
PACK CYSTO (CUSTOM PROCEDURE TRAY) ×3 IMPLANT
SHEATH ACCESS URETERAL 38CM (SHEATH) ×3 IMPLANT
TUBING CONNECTING 10 (TUBING) ×2 IMPLANT
TUBING CONNECTING 10' (TUBING) ×1

## 2015-02-15 NOTE — Progress Notes (Signed)
Patient ID: Tricia Kelly, female   DOB: [birth year], 29 y.o.   MRN: [id number]    Subjective: Tricia Kelly feels better today but had a fever to 101.2 last night and continues to have pain.  Her WBC is down slightly.  ROS:  Review of Systems  Constitutional: Positive for fever.  Gastrointestinal: Negative for nausea.  Genitourinary: Positive for urgency, frequency and flank pain.  All other systems reviewed and are negative.   Anti-infectives: Anti-infectives    Start     Dose/Rate Route Frequency Ordered Stop   02/15/15 1800  cefTRIAXone (ROCEPHIN) 1 g in dextrose 5 % 50 mL IVPB     1 g 100 mL/hr over 30 Minutes Intravenous Every 24 hours 02/14/15 2256     02/14/15 1804  cefTRIAXone (ROCEPHIN) 2 G injection    Comments:  Tricia Kelly   : cabinet override      02/14/15 1804 02/14/15 1807   02/14/15 1800  cefTRIAXone (ROCEPHIN) 2 g in dextrose 5 % 50 mL IVPB     2 g 100 mL/hr over 30 Minutes Intravenous  Once 02/14/15 1757 02/14/15 1855      Current Facility-Administered Medications  Medication Dose Route Frequency Provider Last Rate Last Dose  . 0.9 %  sodium chloride infusion   Intravenous Continuous Therisa Doyne, MD 150 mL/hr at 02/15/15 0626    . acetaminophen (TYLENOL) tablet 650 mg  650 mg Oral Q6H PRN Therisa Doyne, MD       Or  . acetaminophen (TYLENOL) suppository 650 mg  650 mg Rectal Q6H PRN Therisa Doyne, MD      . cefTRIAXone (ROCEPHIN) 1 g in dextrose 5 % 50 mL IVPB  1 g Intravenous Q24H Therisa Doyne, MD      . HYDROcodone-acetaminophen (NORCO/VICODIN) 5-325 MG per tablet 1-2 tablet  1-2 tablet Oral Q4H PRN Therisa Doyne, MD      . HYDROmorphone (DILAUDID) injection 0.5 mg  0.5 mg Intravenous Q4H PRN Therisa Doyne, MD   0.5 mg at 02/15/15 0449  . ondansetron (ZOFRAN) tablet 4 mg  4 mg Oral Q6H PRN Therisa Doyne, MD       Or  . ondansetron (ZOFRAN) injection 4 mg  4 mg Intravenous Q6H PRN Therisa Doyne, MD      . sodium chloride 0.9 %  injection 3 mL  3 mL Intravenous Q12H Therisa Doyne, MD   3 mL at 02/14/15 2346     Objective: Vital signs in last 24 hours: Temp:  [98 F (36.7 C)-101.2 F (38.4 C)] 98 F (36.7 C) (10/01 0531) Pulse Rate:  [76-130] 81 (10/01 0531) Resp:  [16-44] 18 (10/01 0531) BP: (123-149)/(63-88) 138/69 mmHg (10/01 0531) SpO2:  [100 %] 100 % (10/01 0531) Weight:  [77.111 kg (170 lb)] 77.111 kg (170 lb) (09/30 1730)  Intake/Output from previous day: 09/30 0701 - 10/01 0700 In: 3770 [I.V.:1220; IV Piggyback:2550] Out: 501 [Urine:501] Intake/Output this shift:     Physical Exam  Constitutional: She is well-developed, well-nourished, and in no distress.  Cardiovascular: Normal rate and regular rhythm.   Pulmonary/Chest: Effort normal and breath sounds normal. No respiratory distress.  Abdominal: Soft. She exhibits no mass. There is tenderness (RUQ and RCVAT).  Vitals reviewed.   Lab Results:   Recent Labs  02/14/15 1755 02/15/15 0529  WBC 13.3* 11.3*  HGB 13.5 11.6*  HCT 38.3 33.5*  PLT 261 222   BMET  Recent Labs  02/14/15 1755 02/15/15 0529  NA 135 140  K 3.1* 3.7  CL 102 110  CO2 21* 22  GLUCOSE 159* 98  BUN 7 <5*  CREATININE 0.84 0.69  CALCIUM 9.2 8.5*   PT/INR No results for input(s): LABPROT, INR in the last 72 hours. ABG No results for input(s): PHART, HCO3 in the last 72 hours.  Invalid input(s): PCO2, PO2  Studies/Results: Dg Chest 2 View  02/14/2015   CLINICAL DATA:  Right-sided chest pain for 3 days.  EXAM: CHEST  2 VIEW  COMPARISON:  None.  FINDINGS: Bilateral mild interstitial thickening which may reflect an infectious or inflammatory etiology. There is no focal parenchymal opacity. There is no pleural effusion or pneumothorax. The heart and mediastinal contours are unremarkable.  The osseous structures are unremarkable.  IMPRESSION: Bilateral mild interstitial thickening which may reflect an infectious or inflammatory etiology.   Electronically  Signed   By: Elige Ko   On: 02/14/2015 18:50   Ct Renal Stone Study  02/14/2015   CLINICAL DATA:  Right flank pain and right lower quadrant pain for 2 days.  EXAM: CT ABDOMEN AND PELVIS WITHOUT CONTRAST  TECHNIQUE: Multidetector CT imaging of the abdomen and pelvis was performed following the standard protocol without IV contrast.  COMPARISON:  None.  FINDINGS: The liver, spleen, pancreas, gallbladder, adrenal glands and left kidney are normal. The aorta is normal. There is no abdominal lymphadenopathy. There is no small bowel obstruction or diverticulitis. The appendix is normal.  There is right perinephric stranding. There is no nephrolithiasis bilaterally. There is mild right hydronephrosis with probable 1 mm stone in the proximal right ureter on image for 43.  The bladder is normal. The uterus is normal. There is a 4 x 5 cm cyst in the right ovary likely follicular cysts. Pelvic phleboliths are identified. The lung bases are clear. No acute abnormalities identified in the visualized bones.  IMPRESSION: Stranding and inflammation surrounding the right kidney. There is mild right hydronephrosis probably due to a stones 1 mm in the proximal right ureter.  The appendix is normal.   Electronically Signed   By: Sherian Rein M.D.   On: 02/14/2015 18:54   Labs reviewed.   Assessment and Plan: Hydronephrosis She had a fever to 101.2 last night and continues to have pain.  I am going to proceed with cystoscopy and right ureteral stenting this morning.  I have reviewed the risks with the patient.        LOS: 1 day    [name] 02/15/2015 [phone]

## 2015-02-15 NOTE — Progress Notes (Signed)
[facility] PROGRESS NOTE  Tricia Kelly ZOX:[id number] DOB: Jan 17, 1986 DOA: 02/14/2015 PCP: No PCP Per Patient  Assessment/Plan: 1. Right flank pain from right proximal stone with infection/ pyelonephritis : - underwent cystoscopy with right retrograde pyelogram and interpretation.  - appreciate urology recommendations.  - hydration and IV antibiotics and pain control as needed.  - mild hydronephrosis on scan. Monitor.    Hypokalemia: replete as needed.    Sepsis from pyelonephritis : Improved.  Blood cultures and urine cultures are pending.   Code Status: full code.  Family Communication: none at bedside Disposition Plan: pending resolution of hydronephrosis.    Consultants:  urology  Procedures:  cystoscopy  Antibiotics:  Rocephin 10/1  HPI/Subjective: Pain better controlled.   Objective: Filed Vitals:   02/15/15 1354  BP: 130/71  Pulse: 74  Temp: 98.5 F (36.9 C)  Resp: 17    Intake/Output Summary (Last 24 hours) at 02/15/15 1807 Last data filed at 02/15/15 1801  Gross per 24 hour  Intake   3190 ml  Output    951 ml  Net   2239 ml   Filed Weights   02/14/15 1730  Weight: 77.111 kg (170 lb)    Exam:   General:  Alert afebrile comfortable  Cardiovascular: s1s2, no mrg,   Respiratory: clear to auscultation, no wheezing or rhonchi  Abdomen: soft mild tenderness of the right flank,  non distended bowel sounds heard.   Musculoskeletal: no pedal edema.   Data Reviewed: Basic Metabolic Panel:  Recent Labs Lab 02/14/15 1755 02/15/15 0529  NA 135 140  K 3.1* 3.7  CL 102 110  CO2 21* 22  GLUCOSE 159* 98  BUN 7 <5*  CREATININE 0.84 0.69  CALCIUM 9.2 8.5*  MG  --  1.9  PHOS  --  2.3*   Liver Function Tests:  Recent Labs Lab 02/14/15 1755 02/15/15 0529  AST 31 18  ALT 35 27  ALKPHOS 56 45  BILITOT 1.3* 1.4*  PROT 8.1 6.7  ALBUMIN 3.8 3.2*   No results for input(s): LIPASE, AMYLASE in the last 168 hours. No results for  input(s): AMMONIA in the last 168 hours. CBC:  Recent Labs Lab 02/14/15 1755 02/15/15 0529  WBC 13.3* 11.3*  NEUTROABS 10.7*  --   HGB 13.5 11.6*  HCT 38.3 33.5*  MCV 97.5 99.1  PLT 261 222   Cardiac Enzymes: No results for input(s): CKTOTAL, CKMB, CKMBINDEX, TROPONINI in the last 168 hours. BNP (last 3 results) No results for input(s): BNP in the last 8760 hours.  ProBNP (last 3 results) No results for input(s): PROBNP in the last 8760 hours.  CBG: No results for input(s): GLUCAP in the last 168 hours.  Recent Results (from the past 240 hour(s))  Urine culture     Status: None (Preliminary result)   Collection Time: 02/14/15  5:35 PM  Result Value Ref Range Status   Specimen Description URINE, CLEAN CATCH  Final   Special Requests NONE  Final   Culture   Final    NO GROWTH < 12 HOURS Performed at [facility]    Report Status PENDING  Incomplete  Blood Culture (routine x 2)     Status: None (Preliminary result)   Collection Time: 02/14/15  5:55 PM  Result Value Ref Range Status   Specimen Description BLOOD LEFT HAND  Final   Special Requests BOTTLES DRAWN AEROBIC AND ANAEROBIC 5 CC EACH  Final   Culture   Final    NO  GROWTH < 24 HOURS Performed at [facility]    Report Status PENDING  Incomplete  Blood Culture (routine x 2)     Status: None (Preliminary result)   Collection Time: 02/14/15  5:56 PM  Result Value Ref Range Status   Specimen Description BLOOD RIGHT ANTECUBITAL  Final   Special Requests BOTTLES DRAWN AEROBIC AND ANAEROBIC 10 CC  Final   Culture   Final    NO GROWTH < 24 HOURS Performed at [facility]    Report Status PENDING  Incomplete  Surgical pcr screen     Status: None   Collection Time: 02/15/15  7:53 AM  Result Value Ref Range Status   MRSA, PCR NEGATIVE NEGATIVE Final   Staphylococcus aureus NEGATIVE NEGATIVE Final    Comment:        The Xpert SA Assay (FDA approved for NASAL specimens in patients over 59  years of age), is one component of a comprehensive surveillance program.  Test performance has been validated by [facility] for patients greater than or equal to 32 year old. It is not intended to diagnose infection nor to guide or monitor treatment.      Studies: Dg Chest 2 View  02/14/2015   CLINICAL DATA:  Right-sided chest pain for 3 days.  EXAM: CHEST  2 VIEW  COMPARISON:  None.  FINDINGS: Bilateral mild interstitial thickening which may reflect an infectious or inflammatory etiology. There is no focal parenchymal opacity. There is no pleural effusion or pneumothorax. The heart and mediastinal contours are unremarkable.  The osseous structures are unremarkable.  IMPRESSION: Bilateral mild interstitial thickening which may reflect an infectious or inflammatory etiology.   Electronically Signed   By: Elige Ko   On: 02/14/2015 18:50   Ct Renal Stone Study  02/14/2015   CLINICAL DATA:  Right flank pain and right lower quadrant pain for 2 days.  EXAM: CT ABDOMEN AND PELVIS WITHOUT CONTRAST  TECHNIQUE: Multidetector CT imaging of the abdomen and pelvis was performed following the standard protocol without IV contrast.  COMPARISON:  None.  FINDINGS: The liver, spleen, pancreas, gallbladder, adrenal glands and left kidney are normal. The aorta is normal. There is no abdominal lymphadenopathy. There is no small bowel obstruction or diverticulitis. The appendix is normal.  There is right perinephric stranding. There is no nephrolithiasis bilaterally. There is mild right hydronephrosis with probable 1 mm stone in the proximal right ureter on image for 43.  The bladder is normal. The uterus is normal. There is a 4 x 5 cm cyst in the right ovary likely follicular cysts. Pelvic phleboliths are identified. The lung bases are clear. No acute abnormalities identified in the visualized bones.  IMPRESSION: Stranding and inflammation surrounding the right kidney. There is mild right hydronephrosis probably  due to a stones 1 mm in the proximal right ureter.  The appendix is normal.   Electronically Signed   By: Sherian Rein M.D.   On: 02/14/2015 18:54    Scheduled Meds: . cefTRIAXone (ROCEPHIN)  IV  1 g Intravenous Q24H  . sodium chloride  3 mL Intravenous Q12H   Continuous Infusions: . sodium chloride    . lactated ringers      Active Problems:   Pyelonephritis   Hydronephrosis   Nephrolithiasis   Sepsis   Hypokalemia   Right flank pain    Time spent: 35 minutes.     [facility]  [facility] Pager [phone] If 7PM-7AM, please contact night-coverage at [email or web address], password  [email or web address] 02/15/2015, 6:07 PM  LOS: 1 day

## 2015-02-15 NOTE — Anesthesia Postprocedure Evaluation (Signed)
  Anesthesia Post-op Note  Patient: Tricia Kelly  Procedure(s) Performed: Procedure(s) (LRB): CYSTOSCOPY WITH RETROGRADE (Right)  Patient Location: PACU  Anesthesia Type: General  Level of Consciousness: awake and alert   Airway and Oxygen Therapy: Patient Spontanous Breathing  Post-op Pain: mild  Post-op Assessment: Post-op Vital signs reviewed, Patient's Cardiovascular Status Stable, Respiratory Function Stable, Patent Airway and No signs of Nausea or vomiting  Last Vitals:  Filed Vitals:   02/15/15 1120  BP: 135/74  Pulse: 73  Temp: 36.8 C  Resp: 16    Post-op Vital Signs: stable   Complications: No apparent anesthesia complications

## 2015-02-15 NOTE — Op Note (Signed)
Tricia Kelly, Tricia Kelly NO.:  [id number]  MEDICAL RECORD NO.:  [id number]  LOCATION:  1426                         FACILITY:  [facility]  PHYSICIAN:  Excell Seltzer. Annabell Howells, M.D.    DATE OF BIRTH:  March 12, 1986  DATE OF PROCEDURE:  02/15/2015 DATE OF DISCHARGE:                              OPERATIVE REPORT   The patient of Dr. Blake Divine.  PROCEDURE:  Cystoscopy with right retrograde pyelogram and interpretation.  PREOPERATIVE DIAGNOSIS:  Right proximal stone with infection.  POSTOPERATIVE DIAGNOSIS:  Pyelonephritis, no stone found.  SURGEON:  Excell Seltzer. Annabell Howells, M.D.  ANESTHESIA:  General.  SPECIMEN:  None.  DRAINS:  None.  BLOOD LOSS:  None.  COMPLICATIONS:  None.  INDICATIONS:  [name] is a 29 year old, African American female,  who was sent to [place] from [facility] yesterday with right flank pain on a CT that showed a possible 1 mm right proximal stone with mild hydrops and urinary tract infection with criteria suggestive of sepsis. I saw her last night and then recommended cystoscopy with stent placement last night, but she wanted to wait till family arrives,  so we observed her overnight under antibiotics.  She had a T-max of 1 to 1.2 and remained in pain this morning, so I felt it was still worthwhile to consider stent placement.  FINDINGS OF PROCEDURE:  She received a dose of Rocephin within the last 24 hours and was taken to the operating room where general anesthetic was induced.  She was placed in a lithotomy position and fitted with PAS hose.  Her perineum and genitalia were prepped with Betadine solution. She was draped in the usual sterile fashion.  Cystoscopy was performed using a 23-French scope and 30-degree lens. Examination revealed a normal urethra.  The bladder wall had mild trabeculation.  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  Ureteral orifices were unremarkable.  The  right ureteral orifice was cannulated with a 5-French open-end catheter and contrast was instilled. The study revealed an unremarkable ureter.  There was a little bit of tortuosity in the proximal ureter and at most a minimal dilation of the collecting system, however, no filling defect was identified.  A guidewire was then used to aid passage of the ureteral catheter into the renal pelvis.  Upon removal of the wire, there was a drip of clear fluid from the collecting system.  Because I did not see obvious obstruction, the ureteral catheter was removed, and she was then observed fluoroscopically with brisk efflux of dye from the ureteral orifice and decompression of the kidney, it was not felt that stenting was indicated at this point.  Her  bladder was then drained.  She was taken down from lithotomy position.  Her anesthetic was reversed.  She was moved to recovery room in stable condition.  There were no complications.     Excell Seltzer. Annabell Howells, M.D.     JJW/MEDQ  D:  02/15/2015  T:  02/15/2015  Job:  [id number]

## 2015-02-15 NOTE — Transfer of Care (Signed)
Immediate Anesthesia Transfer of Care Note  Patient: Tricia Kelly  Procedure(s) Performed: Procedure(s): CYSTOSCOPY WITH RETROGRADE (Right)  Patient Location: PACU  Anesthesia Type:General  Level of Consciousness:  sedated, patient cooperative and responds to stimulation  Airway & Oxygen Therapy:Patient Spontanous Breathing and Patient connected to face mask oxgen  Post-op Assessment:  Report given to PACU RN and Post -op Vital signs reviewed and stable  Post vital signs:  Reviewed and stable  Last Vitals:  Filed Vitals:   02/15/15 0531  BP: 138/69  Pulse: 81  Temp: 36.7 C  Resp: 18    Complications: No apparent anesthesia complications

## 2015-02-15 NOTE — Discharge Instructions (Signed)
Pyelonephritis, Adult °Pyelonephritis is a kidney infection. A kidney infection can happen quickly, or it can last for a long time. °HOME CARE  °· Take your medicine (antibiotics) as told. Finish it even if you start to feel better. °· Keep all doctor visits as told. °· Drink enough fluids to keep your pee (urine) clear or pale yellow. °· Only take medicine as told by your doctor. °GET HELP RIGHT AWAY IF:  °· You have a fever or lasting symptoms for more than 2-3 days. °· You have a fever and your symptoms suddenly get worse. °· You cannot take your medicine or drink fluids as told. °· You have chills and shaking. °· You feel very weak or pass out (faint). °· You do not feel better after 2 days. °MAKE SURE YOU: °· Understand these instructions. °· Will watch your condition. °· Will get help right away if you are not doing well or get worse. °Document Released: 06/10/2004 Document Revised: 11/02/2011 Document Reviewed: 10/21/2010 °Exit[employer]® Patient Information ©2015 [employer]. This information is not intended to replace advice given to you by your health care provider. Make sure you discuss any questions you have with your health care provider. ° °

## 2015-02-15 NOTE — Anesthesia Preprocedure Evaluation (Signed)
Anesthesia Evaluation  Patient identified by MRN, date of birth, ID band Patient awake    Reviewed: Allergy & Precautions, H&P , NPO status , Patient's Chart, lab work & pertinent test results  Airway Mallampati: II  TM Distance: >3 FB Neck ROM: full    Dental no notable dental hx. (+) Dental Advisory Given, Teeth Intact   Pulmonary neg pulmonary ROS, Current Smoker,    Pulmonary exam normal breath sounds clear to auscultation       Cardiovascular Exercise Tolerance: Good negative cardio ROS Normal cardiovascular exam Rhythm:regular Rate:Normal     Neuro/Psych negative neurological ROS  negative psych ROS   GI/Hepatic negative GI ROS, Neg liver ROS,   Endo/Other  negative endocrine ROS  Renal/GU negative Renal ROS  negative genitourinary   Musculoskeletal   Abdominal   Peds  Hematology negative hematology ROS (+)   Anesthesia Other Findings Pyelonephritis with sepsis  Reproductive/Obstetrics negative OB ROS                             Anesthesia Physical Anesthesia Plan  ASA: III and emergent  Anesthesia Plan: General   Post-op Pain Management:    Induction: Intravenous  Airway Management Planned: LMA  Additional Equipment:   Intra-op Plan:   Post-operative Plan:   Informed Consent: I have reviewed the patients History and Physical, chart, labs and discussed the procedure including the risks, benefits and alternatives for the proposed anesthesia with the patient or authorized representative who has indicated his/her understanding and acceptance.   Dental Advisory Given  Plan Discussed with: CRNA and Surgeon  Anesthesia Plan Comments:         Anesthesia Quick Evaluation

## 2015-02-15 NOTE — Anesthesia Procedure Notes (Signed)
Procedure Name: LMA Insertion Date/Time: 02/15/2015 10:19 AM Performed by: Paris Lore Pre-anesthesia Checklist: Patient identified, Emergency Drugs available, Suction available, Patient being monitored and Timeout performed Patient Re-evaluated:Patient Re-evaluated prior to inductionOxygen Delivery Method: Circle system utilized Preoxygenation: Pre-oxygenation with 100% oxygen Intubation Type: IV induction Ventilation: Mask ventilation without difficulty LMA: LMA inserted LMA Size: 4.0 Number of attempts: 1 Placement Confirmation: positive ETCO2 and breath sounds checked- equal and bilateral Tube secured with: Tape

## 2015-02-15 NOTE — Assessment & Plan Note (Deleted)
She had a fever to 101.2 last night and continues to have pain.  I am going to proceed with cystoscopy and right ureteral stenting this morning.  I have reviewed the risks with the patient.

## 2015-02-15 NOTE — Brief Op Note (Signed)
02/14/2015 - 02/15/2015  10:30 AM  PATIENT:  Tricia Kelly  29 y.o. female  PRE-OPERATIVE DIAGNOSIS:  ureteral stone  POST-OPERATIVE DIAGNOSIS:  Stone not present  PROCEDURE:  Procedure(s): CYSTOSCOPY WITH RETROGRADE (Right)  SURGEON:  Surgeon(s) and Role:    * Bjorn Pippin, MD - Primary  PHYSICIAN ASSISTANT:   ASSISTANTS: none   ANESTHESIA:   general  EBL:  Total I/O In: 0  Out: 450 [Urine:450]  BLOOD ADMINISTERED:none  DRAINS: none   LOCAL MEDICATIONS USED:  NONE  SPECIMEN:  No Specimen  DISPOSITION OF SPECIMEN:  N/A  COUNTS:  YES  TOURNIQUET:  * No tourniquets in log *  DICTATION: .Other Dictation: Dictation Number [id number]  PLAN OF CARE: Admit to inpatient   PATIENT DISPOSITION:  PACU - hemodynamically stable.   Delay start of Pharmacological VTE agent (>24hrs) due to surgical blood loss or risk of bleeding: not applicable

## 2015-02-16 DIAGNOSIS — E876 Hypokalemia: Secondary | ICD-10-CM

## 2015-02-16 MED ORDER — HYDROMORPHONE HCL 1 MG/ML IJ SOLN
1.0000 mg | INTRAMUSCULAR | Status: DC | PRN
  Administered 2015-02-17: 1 mg via INTRAVENOUS
  Filled 2015-02-16 (×2): qty 1

## 2015-02-16 NOTE — Progress Notes (Signed)
[facility] PROGRESS NOTE  Tricia Kelly ONG:[id number] DOB: [birth year] DOA: 02/14/2015 PCP: No PCP Per Patient  Assessment/Plan: 1. Right flank pain from right proximal stone with infection/ pyelonephritis : - underwent cystoscopy with right retrograde pyelogram and interpretation.  - appreciate urology recommendations.  - hydration and IV antibiotics and pain control as needed.  - mild hydronephrosis on scan. Monitor.  - urine cultures show 1,00,000 gram negative rods.   Hypokalemia: replete as needed.    Sepsis from pyelonephritis : Improved.  Blood cultures and urine cultures are pending.   Code Status: full code.  Family Communication: none at bedside Disposition Plan: pending resolution of hydronephrosis.    Consultants:  urology  Procedures:  cystoscopy  Antibiotics:  Rocephin 10/1  HPI/Subjective: PAIN NOT well controlled today.  Objective: Filed Vitals:   02/16/15 1354  BP: 124/76  Pulse: 65  Temp: 97.8 F (36.6 C)  Resp: 18    Intake/Output Summary (Last 24 hours) at 02/16/15 1529 Last data filed at 02/16/15 1300  Gross per 24 hour  Intake    750 ml  Output      0 ml  Net    750 ml   Filed Weights   02/14/15 1730  Weight: 77.111 kg (170 lb)    Exam:   General:  Alert afebrile comfortable  Cardiovascular: s1s2, no mrg,   Respiratory: clear to auscultation, no wheezing or rhonchi  Abdomen: soft mild tenderness of the right flank,  non distended bowel sounds heard.   Musculoskeletal: no pedal edema.   Data Reviewed: Basic Metabolic Panel:  Recent Labs Lab 02/14/15 1755 02/15/15 0529  NA 135 140  K 3.1* 3.7  CL 102 110  CO2 21* 22  GLUCOSE 159* 98  BUN 7 <5*  CREATININE 0.84 0.69  CALCIUM 9.2 8.5*  MG  --  1.9  PHOS  --  2.3*   Liver Function Tests:  Recent Labs Lab 02/14/15 1755 02/15/15 0529  AST 31 18  ALT 35 27  ALKPHOS 56 45  BILITOT 1.3* 1.4*  PROT 8.1 6.7  ALBUMIN 3.8 3.2*   No results for  input(s): LIPASE, AMYLASE in the last 168 hours. No results for input(s): AMMONIA in the last 168 hours. CBC:  Recent Labs Lab 02/14/15 1755 02/15/15 0529  WBC 13.3* 11.3*  NEUTROABS 10.7*  --   HGB 13.5 11.6*  HCT 38.3 33.5*  MCV 97.5 99.1  PLT 261 222   Cardiac Enzymes: No results for input(s): CKTOTAL, CKMB, CKMBINDEX, TROPONINI in the last 168 hours. BNP (last 3 results) No results for input(s): BNP in the last 8760 hours.  ProBNP (last 3 results) No results for input(s): PROBNP in the last 8760 hours.  CBG: No results for input(s): GLUCAP in the last 168 hours.  Recent Results (from the past 240 hour(s))  Urine culture     Status: None (Preliminary result)   Collection Time: 02/14/15  5:35 PM  Result Value Ref Range Status   Specimen Description URINE, CLEAN CATCH  Final   Special Requests NONE  Final   Culture   Final    >=100,000 COLONIES/mL GRAM NEGATIVE RODS Performed at [facility]    Report Status PENDING  Incomplete  Blood Culture (routine x 2)     Status: None (Preliminary result)   Collection Time: 02/14/15  5:55 PM  Result Value Ref Range Status   Specimen Description BLOOD LEFT HAND  Final   Special Requests BOTTLES DRAWN AEROBIC AND ANAEROBIC 5 CC  EACH  Final   Culture   Final    NO GROWTH < 24 HOURS Performed at [facility]    Report Status PENDING  Incomplete  Blood Culture (routine x 2)     Status: None (Preliminary result)   Collection Time: 02/14/15  5:56 PM  Result Value Ref Range Status   Specimen Description BLOOD RIGHT ANTECUBITAL  Final   Special Requests BOTTLES DRAWN AEROBIC AND ANAEROBIC 10 CC  Final   Culture   Final    NO GROWTH < 24 HOURS Performed at [facility]    Report Status PENDING  Incomplete  Surgical pcr screen     Status: None   Collection Time: 02/15/15  7:53 AM  Result Value Ref Range Status   MRSA, PCR NEGATIVE NEGATIVE Final   Staphylococcus aureus NEGATIVE NEGATIVE Final    Comment:         The Xpert SA Assay (FDA approved for NASAL specimens in patients over 64 years of age), is one component of a comprehensive surveillance program.  Test performance has been validated by [facility] for patients greater than or equal to 72 year old. It is not intended to diagnose infection nor to guide or monitor treatment.      Studies: Dg Chest 2 View  02/14/2015   CLINICAL DATA:  Right-sided chest pain for 3 days.  EXAM: CHEST  2 VIEW  COMPARISON:  None.  FINDINGS: Bilateral mild interstitial thickening which may reflect an infectious or inflammatory etiology. There is no focal parenchymal opacity. There is no pleural effusion or pneumothorax. The heart and mediastinal contours are unremarkable.  The osseous structures are unremarkable.  IMPRESSION: Bilateral mild interstitial thickening which may reflect an infectious or inflammatory etiology.   Electronically Signed   By: Elige Ko   On: 02/14/2015 18:50   Ct Renal Stone Study  02/14/2015   CLINICAL DATA:  Right flank pain and right lower quadrant pain for 2 days.  EXAM: CT ABDOMEN AND PELVIS WITHOUT CONTRAST  TECHNIQUE: Multidetector CT imaging of the abdomen and pelvis was performed following the standard protocol without IV contrast.  COMPARISON:  None.  FINDINGS: The liver, spleen, pancreas, gallbladder, adrenal glands and left kidney are normal. The aorta is normal. There is no abdominal lymphadenopathy. There is no small bowel obstruction or diverticulitis. The appendix is normal.  There is right perinephric stranding. There is no nephrolithiasis bilaterally. There is mild right hydronephrosis with probable 1 mm stone in the proximal right ureter on image for 43.  The bladder is normal. The uterus is normal. There is a 4 x 5 cm cyst in the right ovary likely follicular cysts. Pelvic phleboliths are identified. The lung bases are clear. No acute abnormalities identified in the visualized bones.  IMPRESSION: Stranding and  inflammation surrounding the right kidney. There is mild right hydronephrosis probably due to a stones 1 mm in the proximal right ureter.  The appendix is normal.   Electronically Signed   By: Sherian Rein M.D.   On: 02/14/2015 18:54    Scheduled Meds: . cefTRIAXone (ROCEPHIN)  IV  1 g Intravenous Q24H  . sodium chloride  3 mL Intravenous Q12H   Continuous Infusions: . sodium chloride 1,000 mL (02/16/15 1059)  . lactated ringers      Active Problems:   Pyelonephritis   Hydronephrosis   Nephrolithiasis   Sepsis (HCC)   Hypokalemia   Right flank pain    Time spent: 35 minutes.  [facility]  [facility] Pager [phone] If 7PM-7AM, please contact night-coverage at [email or web address], password [facility] 02/16/2015, 3:29 PM  LOS: 2 days

## 2015-02-16 NOTE — Progress Notes (Deleted)
Chaplain rounding. Tricia Kelly is feeling down today. Her daughter has not visited and she feels lonely and frightened. Her lunch was a well done pork chop which she could not cut with one functional hand, this added to her stress. Chaplain provided a listening ear and together with the room nurse, advocacy for a softer diet.   Tricia Kelly demonstrated an short term memory loss. She could not remember points of conversation just minutes after they were discussed, and looped her remarks repeating what she had said moments before, as if it was being discussed for the first time.   She reports she is getting excellent care and was pleased that nurses readily reacted to her complain about "hard food."  Page chaplain if Tricia Saidi needs or requests further spiritual care.  Benjie Karvonen. Celena Lanius, DMin Chaplain

## 2015-02-16 NOTE — Progress Notes (Signed)
Patient ID: Tricia Kelly, female   DOB: 1986-04-07, 29 y.o.   MRN: [id number] 1 Day Post-Op  Subjective: She has right pyelonephritis with no stone or obstruction on cystoscopy and RGP yesterday.  She is feeling better but still has some pain.   She has had chills without fever.   WBC is declining.  ROS:  Review of Systems  Constitutional: Positive for chills. Negative for fever.  Genitourinary: Positive for flank pain.  All other systems reviewed and are negative.   Anti-infectives: Anti-infectives    Start     Dose/Rate Route Frequency Ordered Stop   02/15/15 1800  cefTRIAXone (ROCEPHIN) 1 g in dextrose 5 % 50 mL IVPB     1 g 100 mL/hr over 30 Minutes Intravenous Every 24 hours 02/14/15 2256     02/14/15 1804  cefTRIAXone (ROCEPHIN) 2 G injection    Comments:  [name]   : cabinet override      02/14/15 1804 02/14/15 1807   02/14/15 1800  cefTRIAXone (ROCEPHIN) 2 g in dextrose 5 % 50 mL IVPB     2 g 100 mL/hr over 30 Minutes Intravenous  Once 02/14/15 1757 02/14/15 1855      Current Facility-Administered Medications  Medication Dose Route Frequency Provider Last Rate Last Dose  . 0.9 %  sodium chloride infusion   Intravenous Continuous Paris Lore, CRNA      . acetaminophen (TYLENOL) tablet 650 mg  650 mg Oral Q6H PRN Therisa Doyne, MD       Or  . acetaminophen (TYLENOL) suppository 650 mg  650 mg Rectal Q6H PRN Therisa Doyne, MD      . cefTRIAXone (ROCEPHIN) 1 g in dextrose 5 % 50 mL IVPB  1 g Intravenous Q24H Therisa Doyne, MD   1 g at 02/15/15 1732  . fentaNYL (SUBLIMAZE) injection 25-50 mcg  25-50 mcg Intravenous Q5 min PRN Ronelle Nigh, MD      . HYDROcodone-acetaminophen (NORCO/VICODIN) 5-325 MG per tablet 1-2 tablet  1-2 tablet Oral Q4H PRN Therisa Doyne, MD   2 tablet at 02/16/15 0420  . HYDROmorphone (DILAUDID) injection 0.5 mg  0.5 mg Intravenous Q4H PRN Therisa Doyne, MD   0.5 mg at 02/15/15 0449  . lactated ringers infusion    Intravenous Continuous Ronelle Nigh, MD      . ondansetron [facility]) tablet 4 mg  4 mg Oral Q6H PRN Therisa Doyne, MD       Or  . ondansetron (ZOFRAN) injection 4 mg  4 mg Intravenous Q6H PRN Therisa Doyne, MD   4 mg at 02/15/15 1030  . sodium chloride 0.9 % injection 3 mL  3 mL Intravenous Q12H Therisa Doyne, MD   3 mL at 02/14/15 2346     Objective: Vital signs in last 24 hours: Temp:  [98 F (36.7 C)-98.6 F (37 C)] 98.2 F (36.8 C) (10/02 0432) Pulse Rate:  [64-97] 64 (10/02 0432) Resp:  [14-18] 18 (10/02 0432) BP: (121-135)/(63-83) 126/83 mmHg (10/02 0432) SpO2:  [95 %-100 %] 100 % (10/02 0432)  Intake/Output from previous day: 10/01 0701 - 10/02 0700 In: 1470 [P.O.:720; I.V.:700; IV Piggyback:50] Out: 450 [Urine:450] Intake/Output this shift:     Physical Exam  Constitutional: She is well-developed, well-nourished, and in no distress.  Cardiovascular: Normal rate and regular rhythm.   Pulmonary/Chest: Effort normal. No respiratory distress.  Abdominal: Soft. There is tenderness (right flank).  Vitals reviewed.   Lab Results:   Recent Labs  02/14/15 1755 02/15/15 4098  WBC 13.3* 11.3*  HGB 13.5 11.6*  HCT 38.3 33.5*  PLT 261 222   BMET  Recent Labs  02/14/15 1755 02/15/15 0529  NA 135 140  K 3.1* 3.7  CL 102 110  CO2 21* 22  GLUCOSE 159* 98  BUN 7 <5*  CREATININE 0.84 0.69  CALCIUM 9.2 8.5*   PT/INR No results for input(s): LABPROT, INR in the last 72 hours. ABG No results for input(s): PHART, HCO3 in the last 72 hours.  Invalid input(s): PCO2, PO2  Studies/Results: Dg Chest 2 View  02/14/2015   CLINICAL DATA:  Right-sided chest pain for 3 days.  EXAM: CHEST  2 VIEW  COMPARISON:  None.  FINDINGS: Bilateral mild interstitial thickening which may reflect an infectious or inflammatory etiology. There is no focal parenchymal opacity. There is no pleural effusion or pneumothorax. The heart and mediastinal contours are unremarkable.   The osseous structures are unremarkable.  IMPRESSION: Bilateral mild interstitial thickening which may reflect an infectious or inflammatory etiology.   Electronically Signed   By: Elige Ko   On: 02/14/2015 18:50   Ct Renal Stone Study  02/14/2015   CLINICAL DATA:  Right flank pain and right lower quadrant pain for 2 days.  EXAM: CT ABDOMEN AND PELVIS WITHOUT CONTRAST  TECHNIQUE: Multidetector CT imaging of the abdomen and pelvis was performed following the standard protocol without IV contrast.  COMPARISON:  None.  FINDINGS: The liver, spleen, pancreas, gallbladder, adrenal glands and left kidney are normal. The aorta is normal. There is no abdominal lymphadenopathy. There is no small bowel obstruction or diverticulitis. The appendix is normal.  There is right perinephric stranding. There is no nephrolithiasis bilaterally. There is mild right hydronephrosis with probable 1 mm stone in the proximal right ureter on image for 43.  The bladder is normal. The uterus is normal. There is a 4 x 5 cm cyst in the right ovary likely follicular cysts. Pelvic phleboliths are identified. The lung bases are clear. No acute abnormalities identified in the visualized bones.  IMPRESSION: Stranding and inflammation surrounding the right kidney. There is mild right hydronephrosis probably due to a stones 1 mm in the proximal right ureter.  The appendix is normal.   Electronically Signed   By: Sherian Rein M.D.   On: 02/14/2015 18:54   Labs reviewed.   Cultures pending but UCx NG at 12 hrs.   Assessment and Plan: Pyelonephritis She had no stone or obstruction on cystoscopy yesterday.   She continues to have some pain but is afebrile and her WBC is declining.  She will need culture appropriate oral antibiotic for 10-14 days and f/u in my office in 2-3 weeks.   I will sign off for now.        LOS: 2 days    Anner Crete 02/16/2015 [phone]

## 2015-02-16 NOTE — Assessment & Plan Note (Addendum)
She had no stone or obstruction on cystoscopy yesterday.   She continues to have some pain but is afebrile and her WBC is declining.  She will need culture appropriate oral antibiotic for 10-14 days and f/u in my office in 2-3 weeks.   I will sign off for now.

## 2015-02-17 ENCOUNTER — Encounter (HOSPITAL_COMMUNITY): Payer: Self-pay | Admitting: Urology

## 2015-02-17 LAB — URINE CULTURE

## 2015-02-17 MED ORDER — HYDROCODONE-ACETAMINOPHEN 5-325 MG PO TABS: 1.0000 | ORAL_TABLET | ORAL | Status: AC | PRN

## 2015-02-17 MED ORDER — CIPROFLOXACIN HCL 500 MG PO TABS: 500.0000 mg | ORAL_TABLET | Freq: Two times a day (BID) | ORAL | Status: AC

## 2015-02-17 NOTE — Progress Notes (Signed)
Discharge teaching given to pt including medications and follow-up appts. Pt verbalized understanding of all discharge instructions. IV access was d/c'd. Pt escorted out by RN, driven home by family.

## 2015-02-18 NOTE — Discharge Summary (Signed)
Physician Discharge Summary  Araly Kaas ZOX:[id number] DOB: 1985-06-15 DOA: 02/14/2015  PCP: No PCP Per Patient  Admit date: 02/14/2015 Discharge date: 02/17/2015  Time spent: 30 minutes  Recommendations for Outpatient Follow-up:  1. Follow up with urology as recommended.   Discharge Diagnoses:  Active Problems:   Pyelonephritis   Hydronephrosis   Nephrolithiasis   Sepsis (HCC)   Hypokalemia   Right flank pain   Discharge Condition: improved.  Diet recommendation: regular diet.   Filed Weights   02/14/15 1730  Weight: 77.111 kg (170 lb)    History of present illness:  Tricia Kelly is a 29 y.o. female Presented with 3 day hx of back pain and fevers, . She was admitted for evaluation and management of pyelonephritis and hydronephrosis seen on CT .  Urology [facility] Course:  1. Right flank pain from right proximal stone with infection/ pyelonephritis : - underwent cystoscopy with right retrograde pyelogram and interpretation BY UROLOGY.  - hydration and IV antibiotics and pain control  - mild hydronephrosis on scan. Monitor.  - urine cultures show 1,00,000  E coli, sensitive to ciprofloxacin. She was discharged on the same to complete the course.   Hypokalemia: repletes as needed.    Sepsis from pyelonephritis : Improved.  Blood cultures negative so far.   Procedures:  cystoscopy  Consultations:  urology  Discharge Exam: Filed Vitals:   02/17/15 0503  BP: 132/65  Pulse: 49  Temp: 98.2 F (36.8 C)  Resp: 16    General: alert afebrile comfortable Cardiovascular: s1s2 Respiratory: ctab  Discharge Instructions   Discharge Instructions    Discharge instructions    Complete by:  As directed   Please follow up with urology as recommended.  Complete the course of antibiotics.     Increase activity slowly    Complete by:  As directed           Discharge Medication List as of 02/17/2015 11:50 AM    START taking these medications   Details  ciprofloxacin (CIPRO) 500 MG tablet Take 1 tablet (500 mg total) by mouth 2 (two) times daily., Starting 02/17/2015, Until Discontinued, Print    HYDROcodone-acetaminophen (NORCO/VICODIN) 5-325 MG tablet Take 1-2 tablets by mouth every 4 (four) hours as needed for moderate pain., Starting 02/17/2015, Until Discontinued, Print      CONTINUE these medications which have NOT CHANGED   Details  acetaminophen (TYLENOL) 500 MG tablet Take 1,000 mg by mouth every 6 (six) hours as needed for moderate pain., Until Discontinued, Historical Med       No Known Allergies Follow-up Information    Follow up with Anner Crete, MD.   Specialty:  Urology   Why:  Please call the office to make and appoint with me or one our nurse practitioners for 2-3 weeks from now.    Contact information:   [address] [place] [address] [phone]        The results of significant diagnostics from this hospitalization (including imaging, microbiology, ancillary and laboratory) are listed below for reference.    Significant Diagnostic Studies: Dg Chest 2 View  02/14/2015   CLINICAL DATA:  Right-sided chest pain for 3 days.  EXAM: CHEST  2 VIEW  COMPARISON:  None.  FINDINGS: Bilateral mild interstitial thickening which may reflect an infectious or inflammatory etiology. There is no focal parenchymal opacity. There is no pleural effusion or pneumothorax. The heart and mediastinal contours are unremarkable.  The osseous structures are unremarkable.  IMPRESSION: Bilateral  mild interstitial thickening which may reflect an infectious or inflammatory etiology.   Electronically Signed   By: Elige Ko   On: 02/14/2015 18:50   Ct Renal Stone Study  02/14/2015   CLINICAL DATA:  Right flank pain and right lower quadrant pain for 2 days.  EXAM: CT ABDOMEN AND PELVIS WITHOUT CONTRAST  TECHNIQUE: Multidetector CT imaging of the abdomen and pelvis was performed following the standard protocol without IV contrast.   COMPARISON:  None.  FINDINGS: The liver, spleen, pancreas, gallbladder, adrenal glands and left kidney are normal. The aorta is normal. There is no abdominal lymphadenopathy. There is no small bowel obstruction or diverticulitis. The appendix is normal.  There is right perinephric stranding. There is no nephrolithiasis bilaterally. There is mild right hydronephrosis with probable 1 mm stone in the proximal right ureter on image for 43.  The bladder is normal. The uterus is normal. There is a 4 x 5 cm cyst in the right ovary likely follicular cysts. Pelvic phleboliths are identified. The lung bases are clear. No acute abnormalities identified in the visualized bones.  IMPRESSION: Stranding and inflammation surrounding the right kidney. There is mild right hydronephrosis probably due to a stones 1 mm in the proximal right ureter.  The appendix is normal.   Electronically Signed   By: Sherian Rein M.D.   On: 02/14/2015 18:54    Microbiology: Recent Results (from the past 240 hour(s))  Urine culture     Status: None   Collection Time: 02/14/15  5:35 PM  Result Value Ref Range Status   Specimen Description URINE, CLEAN CATCH  Final   Special Requests NONE  Final   Culture   Final    >=100,000 COLONIES/mL ESCHERICHIA COLI Performed at [facility]    Report Status 02/17/2015 FINAL  Final   Organism ID, Bacteria ESCHERICHIA COLI  Final      Susceptibility   Escherichia coli - MIC*    AMPICILLIN >=32 RESISTANT Resistant     CEFAZOLIN <=4 SENSITIVE Sensitive     CEFTRIAXONE <=1 SENSITIVE Sensitive     CIPROFLOXACIN <=0.25 SENSITIVE Sensitive     GENTAMICIN >=16 RESISTANT Resistant     IMIPENEM <=0.25 SENSITIVE Sensitive     NITROFURANTOIN 32 SENSITIVE Sensitive     TRIMETH/SULFA >=320 RESISTANT Resistant     AMPICILLIN/SULBACTAM 16 INTERMEDIATE Intermediate     PIP/TAZO <=4 SENSITIVE Sensitive     * >=100,000 COLONIES/mL ESCHERICHIA COLI  Blood Culture (routine x 2)     Status: None  (Preliminary result)   Collection Time: 02/14/15  5:55 PM  Result Value Ref Range Status   Specimen Description BLOOD LEFT HAND  Final   Special Requests BOTTLES DRAWN AEROBIC AND ANAEROBIC 5 CC EACH  Final   Culture   Final    NO GROWTH 3 DAYS Performed at [facility]    Report Status PENDING  Incomplete  Blood Culture (routine x 2)     Status: None (Preliminary result)   Collection Time: 02/14/15  5:56 PM  Result Value Ref Range Status   Specimen Description BLOOD RIGHT ANTECUBITAL  Final   Special Requests BOTTLES DRAWN AEROBIC AND ANAEROBIC 10 CC  Final   Culture   Final    NO GROWTH 3 DAYS Performed at [facility]    Report Status PENDING  Incomplete  Surgical pcr screen     Status: None   Collection Time: 02/15/15  7:53 AM  Result Value Ref Range Status  MRSA, PCR NEGATIVE NEGATIVE Final   Staphylococcus aureus NEGATIVE NEGATIVE Final    Comment:        The Xpert SA Assay (FDA approved for NASAL specimens in patients over 82 years of age), is one component of a comprehensive surveillance program.  Test performance has been validated by [facility] for patients greater than or equal to 71 year old. It is not intended to diagnose infection nor to guide or monitor treatment.      Labs: Basic Metabolic Panel:  Recent Labs Lab 02/14/15 1755 02/15/15 0529  NA 135 140  K 3.1* 3.7  CL 102 110  CO2 21* 22  GLUCOSE 159* 98  BUN 7 <5*  CREATININE 0.84 0.69  CALCIUM 9.2 8.5*  MG  --  1.9  PHOS  --  2.3*   Liver Function Tests:  Recent Labs Lab 02/14/15 1755 02/15/15 0529  AST 31 18  ALT 35 27  ALKPHOS 56 45  BILITOT 1.3* 1.4*  PROT 8.1 6.7  ALBUMIN 3.8 3.2*   No results for input(s): LIPASE, AMYLASE in the last 168 hours. No results for input(s): AMMONIA in the last 168 hours. CBC:  Recent Labs Lab 02/14/15 1755 02/15/15 0529  WBC 13.3* 11.3*  NEUTROABS 10.7*  --   HGB 13.5 11.6*  HCT 38.3 33.5*  MCV 97.5 99.1  PLT 261 222    Cardiac Enzymes: No results for input(s): CKTOTAL, CKMB, CKMBINDEX, TROPONINI in the last 168 hours. BNP: BNP (last 3 results) No results for input(s): BNP in the last 8760 hours.  ProBNP (last 3 results) No results for input(s): PROBNP in the last 8760 hours.  CBG: No results for input(s): GLUCAP in the last 168 hours.     SignedKathlen Mody  [facility] 02/18/2015, 9:40 AM

## 2015-02-19 LAB — CULTURE, BLOOD (ROUTINE X 2)
CULTURE: NO GROWTH
Culture: NO GROWTH

## 2016-06-17 IMAGING — CT CT RENAL STONE PROTOCOL
2 of 4 series · 17 of 46 positions shown, 19 images · non-contrast
Comparison: None.

CLINICAL DATA: Right flank pain and right lower quadrant pain for 2
days.

EXAM:
CT ABDOMEN AND PELVIS WITHOUT CONTRAST
TECHNIQUE: Multidetector CT imaging of the abdomen and pelvis was performed
following the standard protocol without IV contrast.

[Series 2: renal stone < 200 lbs 5.0 b31f · axial · 0.82mm/px · z∈[-451,-31]mm · 14 of 92 slices shown, 16 images]
[im 4/92  soft-tissue]
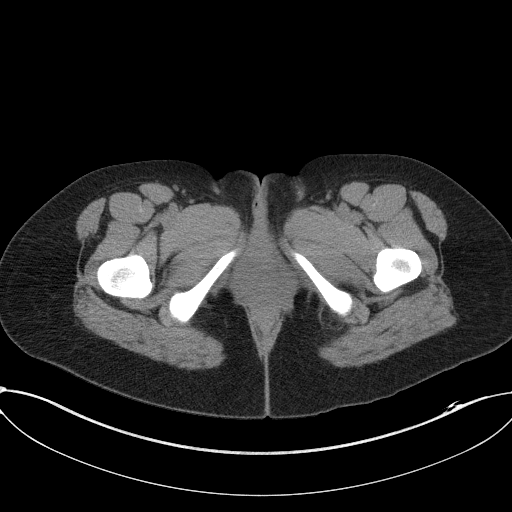
[im 4/92  bone]
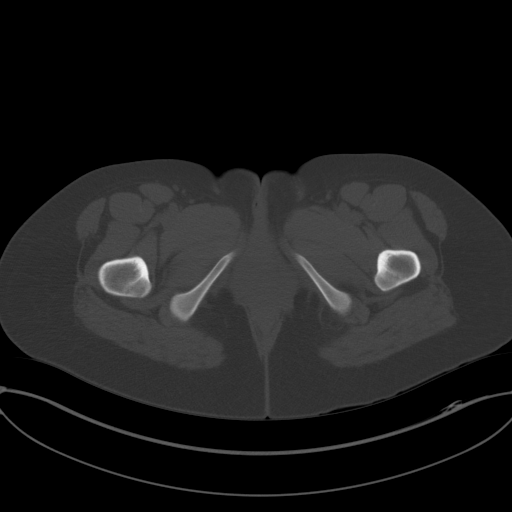
[im 11/92  soft-tissue]
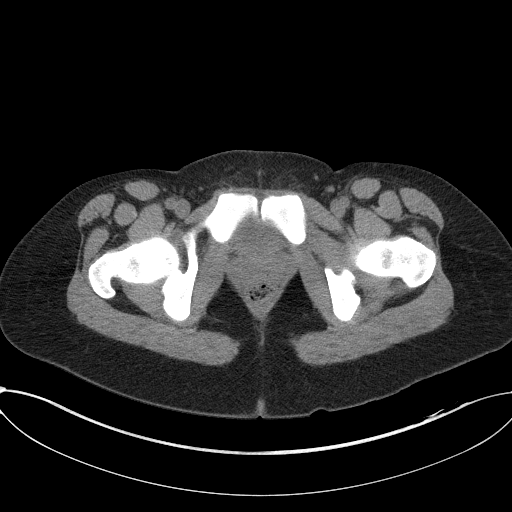
[im 18/92  soft-tissue]
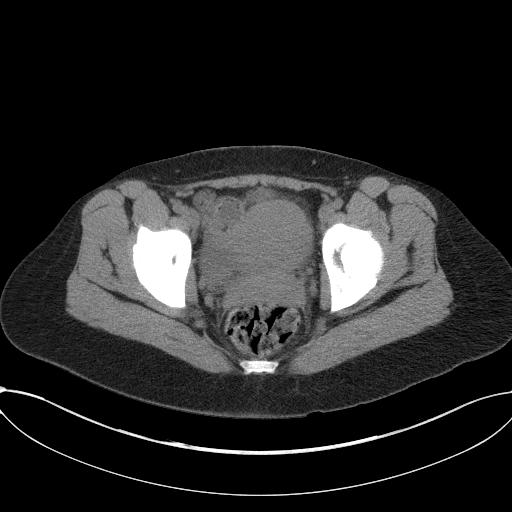
[im 25/92  soft-tissue]
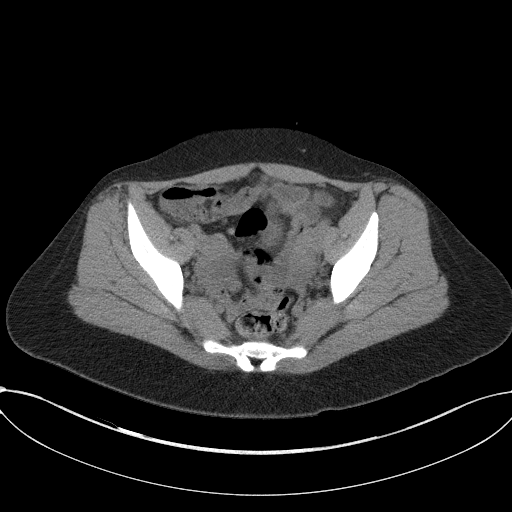
[im 32/92  soft-tissue]
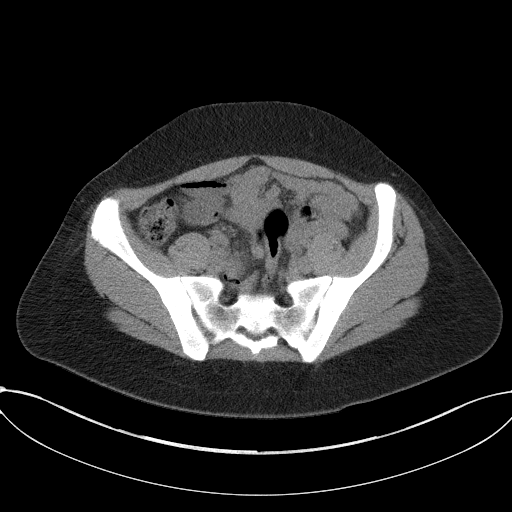
[im 36/92  soft-tissue]
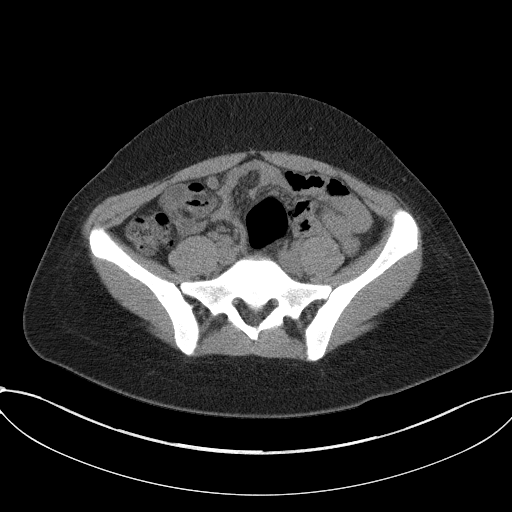
[im 43/92  soft-tissue]
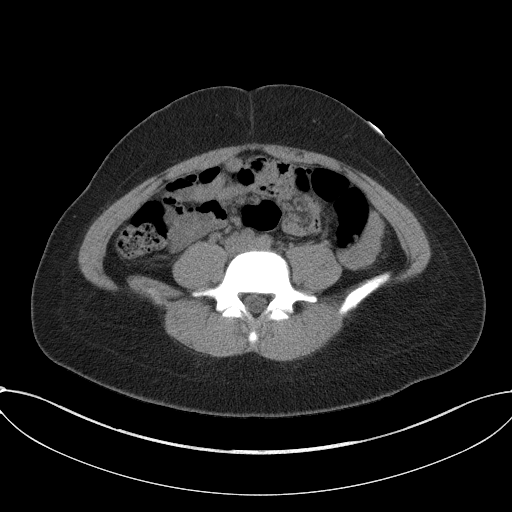
[im 50/92  soft-tissue]
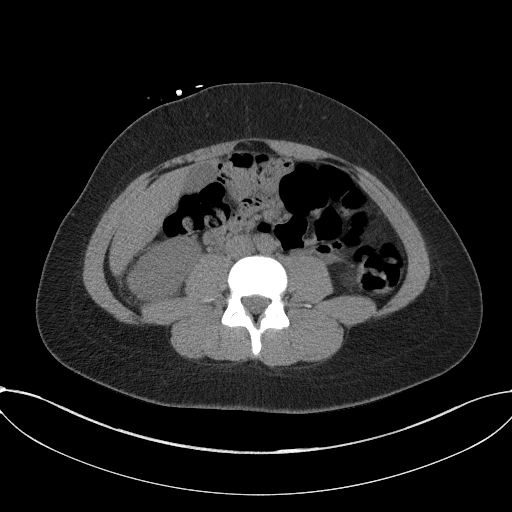
[im 57/92  soft-tissue]
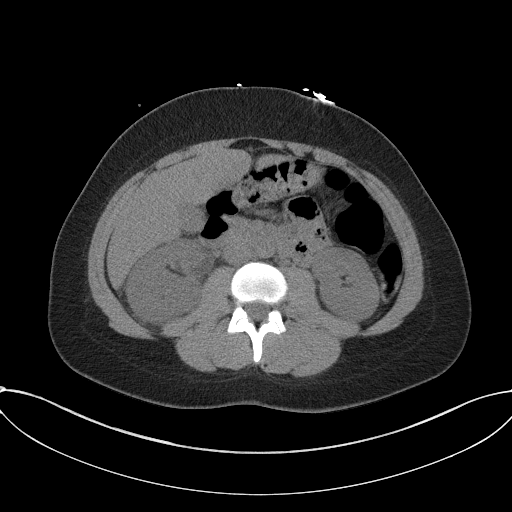
[im 57/92  bone]
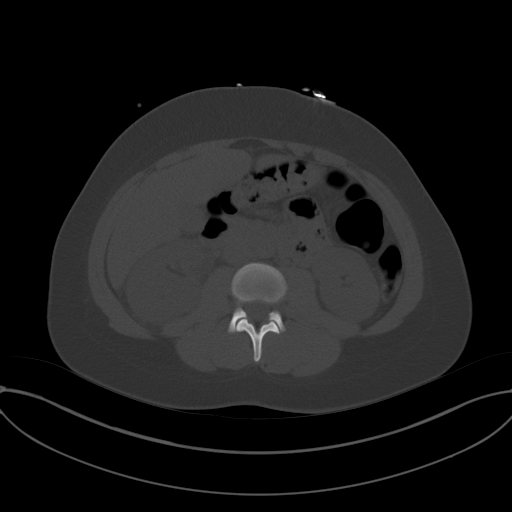
[im 60/92  soft-tissue]
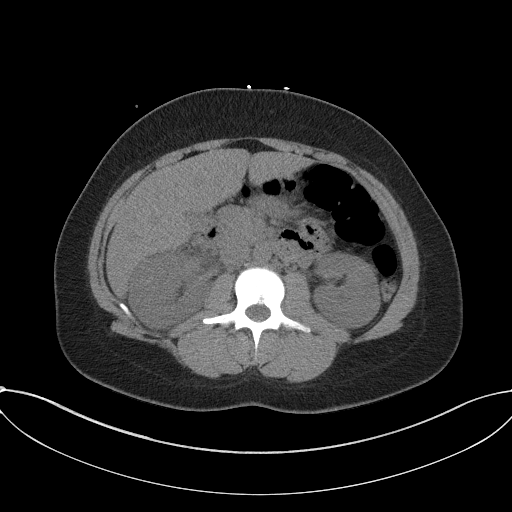
[im 67/92  soft-tissue]
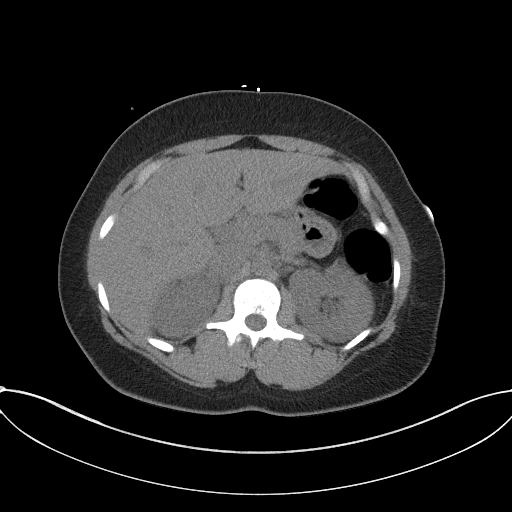
[im 74/92  soft-tissue]
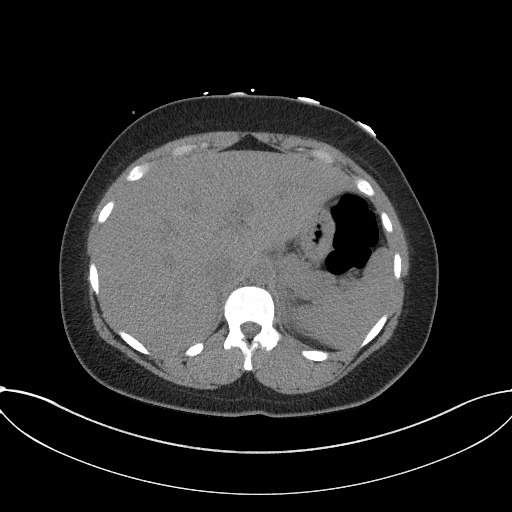
[im 81/92  soft-tissue]
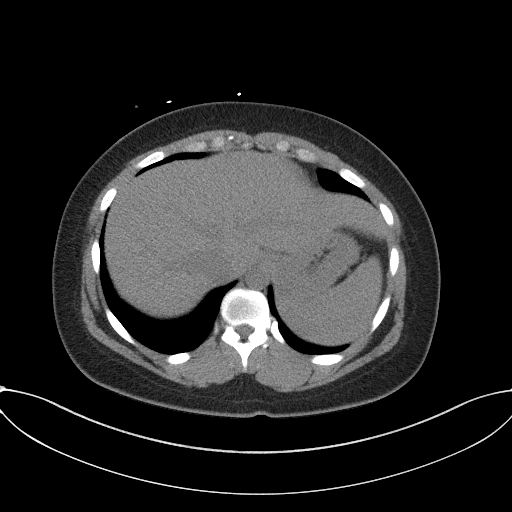
[im 88/92  soft-tissue]
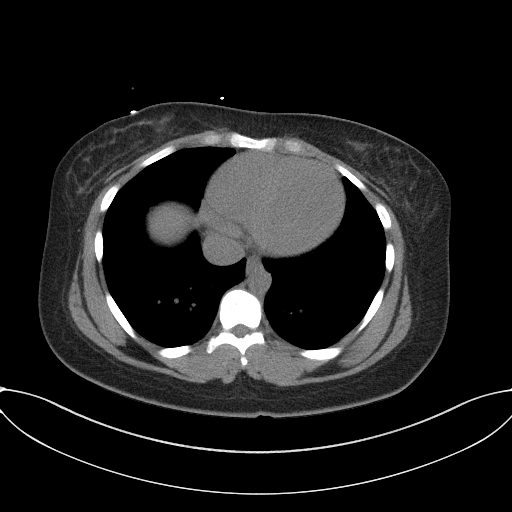

[Series 5: renal stone 3.0 coronal · coronal · 0.89mm/px · 3 of 89 slices shown]
[im 30/89  soft-tissue]
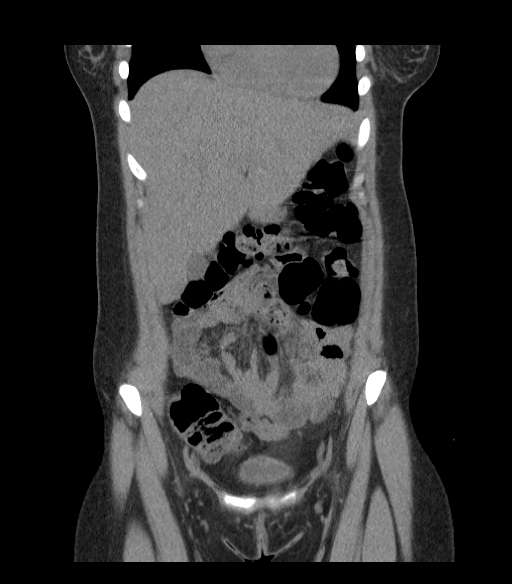
[im 40/89  soft-tissue]
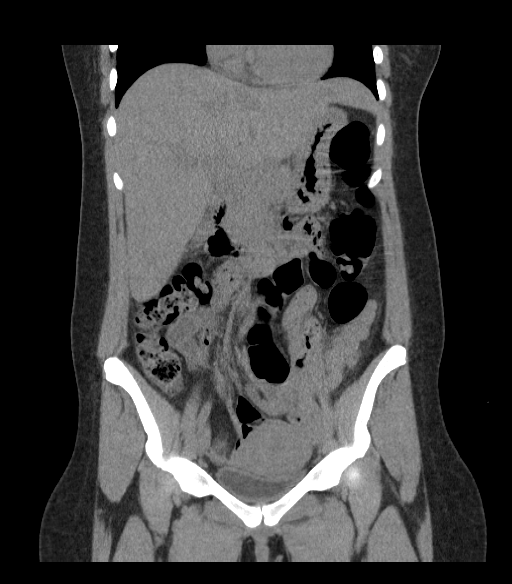
[im 49/89  soft-tissue]
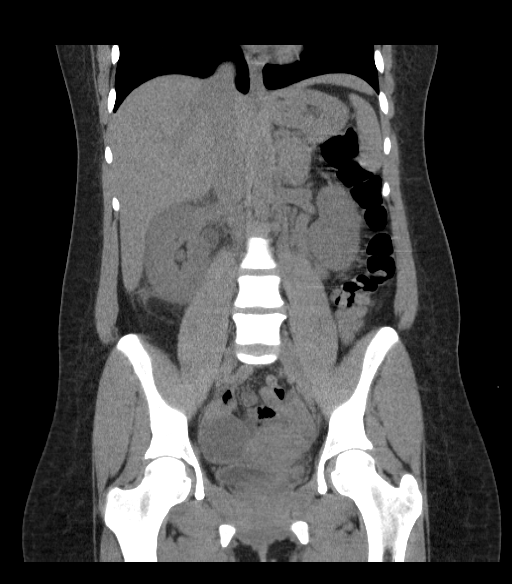

[17 of 46 positions shown; findings below may reference images not displayed]

FINDINGS: The liver, spleen, pancreas, gallbladder, adrenal glands and left
kidney are normal. The aorta is normal. There is no abdominal
lymphadenopathy. There is no small bowel obstruction or
diverticulitis. The appendix is normal.

There is right perinephric stranding. There is no nephrolithiasis
bilaterally. There is mild right hydronephrosis with probable 1 mm
stone in the proximal right ureter on image for 43.

The bladder is normal. The uterus is normal. There is a 4 x 5 cm
cyst in the right ovary likely follicular cysts. Pelvic phleboliths
are identified. The lung bases are clear. No acute abnormalities
identified in the visualized bones.
IMPRESSION: Stranding and inflammation surrounding the right kidney. There is
mild right hydronephrosis probably due to a stones 1 mm in the
proximal right ureter.

The appendix is normal.
